# Patient Record
Sex: Female | Born: 1985 | Race: White | Hispanic: No | Smoking: Former smoker
Health system: Southern US, Community
[De-identification: ages and names within clinical notes are randomized; demographics above are authoritative.]

## PROBLEM LIST (undated history)

## (undated) DIAGNOSIS — N83209 Unspecified ovarian cyst, unspecified side: Secondary | ICD-10-CM

## (undated) DIAGNOSIS — N920 Excessive and frequent menstruation with regular cycle: Secondary | ICD-10-CM

## (undated) DIAGNOSIS — R51 Headache: Secondary | ICD-10-CM

## (undated) DIAGNOSIS — IMO0002 Reserved for concepts with insufficient information to code with codable children: Secondary | ICD-10-CM

## (undated) DIAGNOSIS — R519 Headache, unspecified: Secondary | ICD-10-CM

## (undated) DIAGNOSIS — N941 Unspecified dyspareunia: Secondary | ICD-10-CM

## (undated) DIAGNOSIS — J302 Other seasonal allergic rhinitis: Secondary | ICD-10-CM

## (undated) DIAGNOSIS — N921 Excessive and frequent menstruation with irregular cycle: Secondary | ICD-10-CM

## (undated) DIAGNOSIS — N730 Acute parametritis and pelvic cellulitis: Secondary | ICD-10-CM

## (undated) DIAGNOSIS — R638 Other symptoms and signs concerning food and fluid intake: Secondary | ICD-10-CM

## (undated) HISTORY — DX: Reserved for concepts with insufficient information to code with codable children: IMO0002

## (undated) HISTORY — DX: Excessive and frequent menstruation with irregular cycle: N92.1

## (undated) HISTORY — PX: OTHER SURGICAL HISTORY: SHX169

## (undated) HISTORY — DX: Unspecified ovarian cyst, unspecified side: N83.209

## (undated) HISTORY — PX: WISDOM TOOTH EXTRACTION: SHX21

## (undated) HISTORY — DX: Other seasonal allergic rhinitis: J30.2

## (undated) HISTORY — DX: Headache: R51

## (undated) HISTORY — DX: Acute parametritis and pelvic cellulitis: N73.0

## (undated) HISTORY — DX: Unspecified dyspareunia: N94.10

## (undated) HISTORY — DX: Excessive and frequent menstruation with regular cycle: N92.0

## (undated) HISTORY — DX: Headache, unspecified: R51.9

## (undated) HISTORY — DX: Other symptoms and signs concerning food and fluid intake: R63.8

## (undated) NOTE — *Deleted (*Deleted)
Preventive Care 21-39 Years Old, Female Preventive care refers to visits with your health care provider and lifestyle choices that can promote health and wellness. This includes:  A yearly physical exam. This may also be called an annual well check.  Regular dental visits and eye exams.  Immunizations.  Screening for certain conditions.  Healthy lifestyle choices, such as eating a healthy diet, getting regular exercise, not using drugs or products that contain nicotine and tobacco, and limiting alcohol use. What can I expect for my preventive care visit? Physical exam Your health care provider will check your:  Height and weight. This may be used to calculate body mass index (BMI), which tells if you are at a healthy weight.  Heart rate and blood pressure.  Skin for abnormal spots. Counseling Your health care provider may ask you questions about your:  Alcohol, tobacco, and drug use.  Emotional well-being.  Home and relationship well-being.  Sexual activity.  Eating habits.  Work and work environment.  Method of birth control.  Menstrual cycle.  Pregnancy history. What immunizations do I need?  Influenza (flu) vaccine  This is recommended every year. Tetanus, diphtheria, and pertussis (Tdap) vaccine  You may need a Td booster every 10 years. Varicella (chickenpox) vaccine  You may need this if you have not been vaccinated. Human papillomavirus (HPV) vaccine  If recommended by your health care provider, you may need three doses over 6 months. Measles, mumps, and rubella (MMR) vaccine  You may need at least one dose of MMR. You may also need a second dose. Meningococcal conjugate (MenACWY) vaccine  One dose is recommended if you are age 19-21 years and a first-year college student living in a residence hall, or if you have one of several medical conditions. You may also need additional booster doses. Pneumococcal conjugate (PCV13) vaccine  You may need  this if you have certain conditions and were not previously vaccinated. Pneumococcal polysaccharide (PPSV23) vaccine  You may need one or two doses if you smoke cigarettes or if you have certain conditions. Hepatitis A vaccine  You may need this if you have certain conditions or if you travel or work in places where you may be exposed to hepatitis A. Hepatitis B vaccine  You may need this if you have certain conditions or if you travel or work in places where you may be exposed to hepatitis B. Haemophilus influenzae type b (Hib) vaccine  You may need this if you have certain conditions. You may receive vaccines as individual doses or as more than one vaccine together in one shot (combination vaccines). Talk with your health care provider about the risks and benefits of combination vaccines. What tests do I need?  Blood tests  Lipid and cholesterol levels. These may be checked every 5 years starting at age 20.  Hepatitis C test.  Hepatitis B test. Screening  Diabetes screening. This is done by checking your blood sugar (glucose) after you have not eaten for a while (fasting).  Sexually transmitted disease (STD) testing.  BRCA-related cancer screening. This may be done if you have a family history of breast, ovarian, tubal, or peritoneal cancers.  Pelvic exam and Pap test. This may be done every 3 years starting at age 21. Starting at age 30, this may be done every 5 years if you have a Pap test in combination with an HPV test. Talk with your health care provider about your test results, treatment options, and if necessary, the need for more tests.   Follow these instructions at home: Eating and drinking   Eat a diet that includes fresh fruits and vegetables, whole grains, lean protein, and low-fat dairy.  Take vitamin and mineral supplements as recommended by your health care provider.  Do not drink alcohol if: ? Your health care provider tells you not to drink. ? You are  pregnant, may be pregnant, or are planning to become pregnant.  If you drink alcohol: ? Limit how much you have to 0-1 drink a day. ? Be aware of how much alcohol is in your drink. In the [place] one drink equals one 12 oz bottle of beer (355 mL), one 5 oz glass of wine (148 mL), or one 1 oz glass of hard liquor (44 mL). Lifestyle  Take daily care of your teeth and gums.  Stay active. Exercise for at least 30 minutes on 5 or more days each week.  Do not use any products that contain nicotine or tobacco, such as cigarettes, e-cigarettes, and chewing tobacco. If you need help quitting, ask your health care provider.  If you are sexually active, practice safe sex. Use a condom or other form of birth control (contraception) in order to prevent pregnancy and STIs (sexually transmitted infections). If you plan to become pregnant, see your health care provider for a preconception visit. What's next?  Visit your health care provider once a year for a well check visit.  Ask your health care provider how often you should have your eyes and teeth checked.  Stay up to date on all vaccines. This information is not intended to replace advice given to you by your health care provider. Make sure you discuss any questions you have with your health care provider. Document Revised: 10/20/2017 Document Reviewed: 10/20/2017 Elsevier Patient Education  2020 [employer] Breast Self-Awareness Breast self-awareness is knowing how your breasts look and feel. Doing breast self-awareness is important. It allows you to catch a breast problem early while it is still small and can be treated. All women should do breast self-awareness, including women who have had breast implants. Tell your doctor if you notice a change in your breasts. What you need:  A mirror.  A well-lit room. How to do a breast self-exam A breast self-exam is one way to learn what is normal for your breasts and to check for changes. To do a  breast self-exam: Look for changes  1. Take off all the clothes above your waist. 2. Stand in front of a mirror in a room with good lighting. 3. Put your hands on your hips. 4. Push your hands down. 5. Look at your breasts and nipples in the mirror to see if one breast or nipple looks different from the other. Check to see if: ? The shape of one breast is different. ? The size of one breast is different. ? There are wrinkles, dips, and bumps in one breast and not the other. 6. Look at each breast for changes in the skin, such as: ? Redness. ? Scaly areas. 7. Look for changes in your nipples, such as: ? Liquid around the nipples. ? Bleeding. ? Dimpling. ? Redness. ? A change in where the nipples are. Feel for changes  1. Lie on your back on the floor. 2. Feel each breast. To do this, follow these steps: ? Pick a breast to feel. ? Put the arm closest to that breast above your head. ? Use your other arm to feel the nipple area of your breast. Feel   the area with the pads of your three middle fingers by making small circles with your fingers. For the first circle, press lightly. For the second circle, press harder. For the third circle, press even harder. ? Keep making circles with your fingers at the different pressures as you move down your breast. Stop when you feel your ribs. ? Move your fingers a little toward the center of your body. ? Start making circles with your fingers again, this time going up until you reach your collarbone. ? Keep making up-and-down circles until you reach your armpit. Remember to keep using the three pressures. ? Feel the other breast in the same way. 3. Sit or stand in the tub or shower. 4. With soapy water on your skin, feel each breast the same way you did in step 2 when you were lying on the floor. Write down what you find Writing down what you find can help you remember what to tell your doctor. Write down:  What is normal for each breast.  Any  changes you find in each breast, including: ? The kind of changes you find. ? Whether you have pain. ? Size and location of any lumps.  When you last had your menstrual period. General tips  Check your breasts every month.  If you are breastfeeding, the best time to check your breasts is after you feed your baby or after you use a breast pump.  If you get menstrual periods, the best time to check your breasts is 5-7 days after your menstrual period is over.  With time, you will become comfortable with the self-exam, and you will begin to know if there are changes in your breasts. Contact a doctor if you:  See a change in the shape or size of your breasts or nipples.  See a change in the skin of your breast or nipples, such as red or scaly skin.  Have fluid coming from your nipples that is not normal.  Find a lump or thick area that was not there before.  Have pain in your breasts.  Have any concerns about your breast health. Summary  Breast self-awareness includes looking for changes in your breasts, as well as feeling for changes within your breasts.  Breast self-awareness should be done in front of a mirror in a well-lit room.  You should check your breasts every month. If you get menstrual periods, the best time to check your breasts is 5-7 days after your menstrual period is over.  Let your doctor know of any changes you see in your breasts, including changes in size, changes on the skin, pain or tenderness, or fluid from your nipples that is not normal. This information is not intended to replace advice given to you by your health care provider. Make sure you discuss any questions you have with your health care provider. Document Revised: 09/27/2017 Document Reviewed: 09/27/2017 Elsevier Patient Education  2020 Elsevier Inc.  

---

## 2013-10-07 ENCOUNTER — Emergency Department: Payer: Self-pay | Admitting: Internal Medicine

## 2013-10-07 LAB — CBC WITH DIFFERENTIAL/PLATELET
Basophil #: 0.1 10*3/uL (ref 0.0–0.1)
Basophil %: 0.7 %
EOS ABS: 0.2 10*3/uL (ref 0.0–0.7)
EOS PCT: 1.9 %
HCT: 39.2 % (ref 35.0–47.0)
HGB: 13.7 g/dL (ref 12.0–16.0)
Lymphocyte #: 3.2 10*3/uL (ref 1.0–3.6)
Lymphocyte %: 35.1 %
MCH: 32.3 pg (ref 26.0–34.0)
MCHC: 34.9 g/dL (ref 32.0–36.0)
MCV: 93 fL (ref 80–100)
Monocyte #: 0.7 x10 3/mm (ref 0.2–0.9)
Monocyte %: 7.3 %
Neutrophil #: 5 10*3/uL (ref 1.4–6.5)
Neutrophil %: 55 %
Platelet: 244 10*3/uL (ref 150–440)
RBC: 4.23 10*6/uL (ref 3.80–5.20)
RDW: 13.1 % (ref 11.5–14.5)
WBC: 9 10*3/uL (ref 3.6–11.0)

## 2013-10-07 LAB — COMPREHENSIVE METABOLIC PANEL
ALK PHOS: 46 U/L
ANION GAP: 5 — AB (ref 7–16)
AST: 16 U/L (ref 15–37)
Albumin: 4.1 g/dL (ref 3.4–5.0)
BUN: 16 mg/dL (ref 7–18)
Bilirubin,Total: 0.7 mg/dL (ref 0.2–1.0)
CALCIUM: 8.7 mg/dL (ref 8.5–10.1)
Chloride: 107 mmol/L (ref 98–107)
Co2: 25 mmol/L (ref 21–32)
Creatinine: 0.77 mg/dL (ref 0.60–1.30)
EGFR (African American): >60
EGFR (Non-African Amer.): >60
Glucose: 98 mg/dL (ref 65–99)
OSMOLALITY: 275 (ref 275–301)
POTASSIUM: 3.7 mmol/L (ref 3.5–5.1)
SGPT (ALT): 27 U/L
SODIUM: 137 mmol/L (ref 136–145)
Total Protein: 7.5 g/dL (ref 6.4–8.2)

## 2013-10-07 LAB — URINALYSIS, COMPLETE
Bacteria: NONE SEEN
Bilirubin,UR: NEGATIVE
Glucose,UR: NEGATIVE mg/dL (ref 0–75)
KETONE: NEGATIVE
Leukocyte Esterase: NEGATIVE
Nitrite: NEGATIVE
PH: 5 (ref 4.5–8.0)
Protein: NEGATIVE
Specific Gravity: 1.025 (ref 1.003–1.030)
Squamous Epithelial: 1

## 2013-10-07 LAB — GC/CHLAMYDIA PROBE AMP

## 2013-10-07 LAB — LIPASE, BLOOD: Lipase: 145 U/L (ref 73–393)

## 2013-10-07 LAB — WET PREP, GENITAL

## 2014-04-04 LAB — HM PAP SMEAR: HM Pap smear: NEGATIVE

## 2015-01-11 ENCOUNTER — Other Ambulatory Visit: Payer: Self-pay | Admitting: Obstetrics and Gynecology

## 2015-04-02 ENCOUNTER — Encounter: Payer: Self-pay | Admitting: Obstetrics and Gynecology

## 2016-04-22 IMAGING — US US PELV - US TRANSVAGINAL
1 series · 13 of 25 positions shown · non-contrast
Comparison: None

CLINICAL DATA: Pelvic pain.

EXAM:
TRANSABDOMINAL AND TRANSVAGINAL ULTRASOUND OF PELVIS
TECHNIQUE: Both transabdominal and transvaginal ultrasound examinations of the
pelvis were performed. Transabdominal technique was performed for
global imaging of the pelvis including uterus, ovaries, adnexal
regions, and pelvic cul-de-sac. It was necessary to proceed with
endovaginal exam following the transabdominal exam to visualize the
endometrium and right ovary to better advantage.

[Series 1: us pelv - us transvaginal · 0.24mm/px · 13 of 77 slices shown]
[im 1/77]
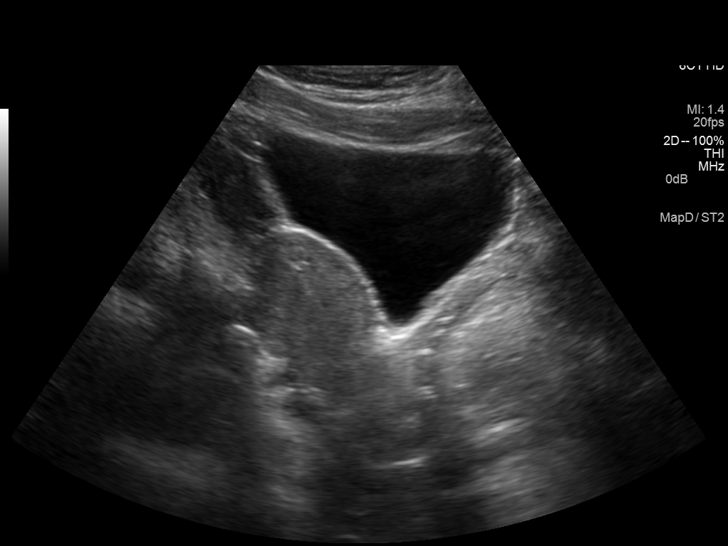
[im 7/77]
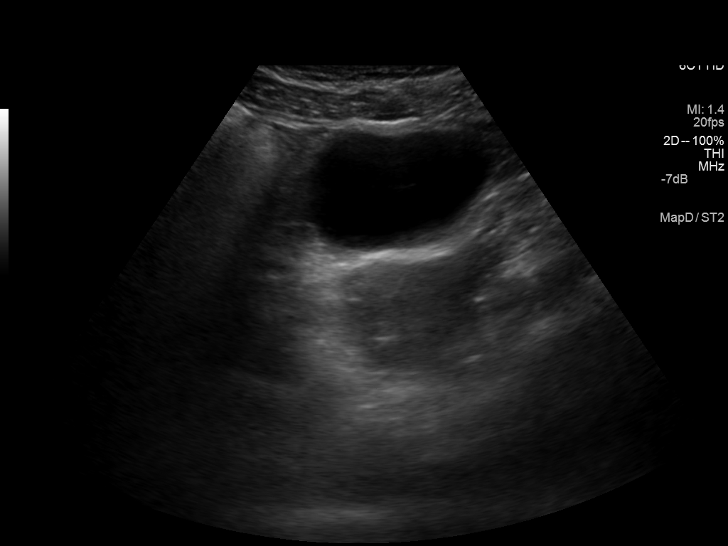
[im 13/77]
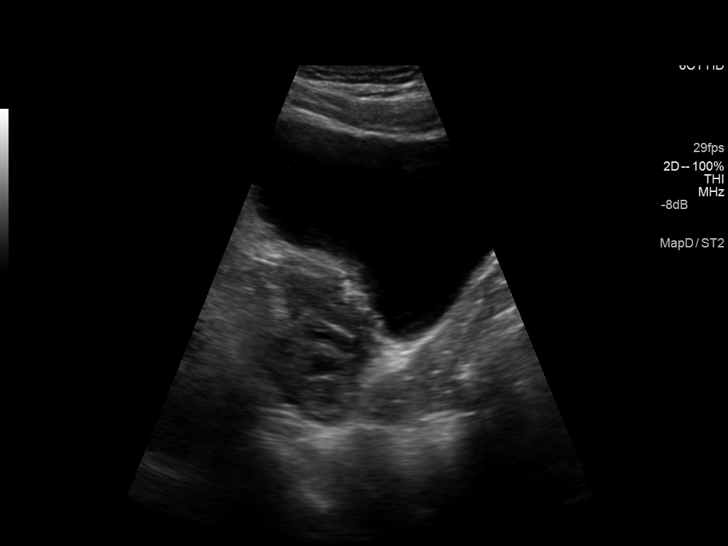
[im 20/77]
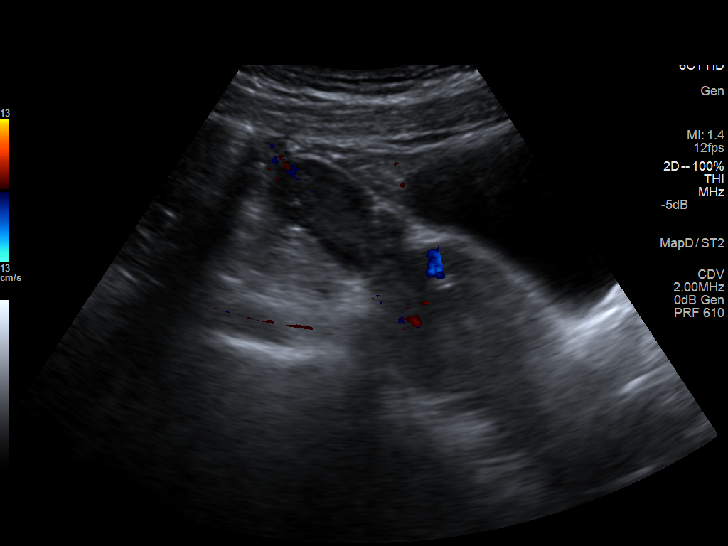
[im 26/77]
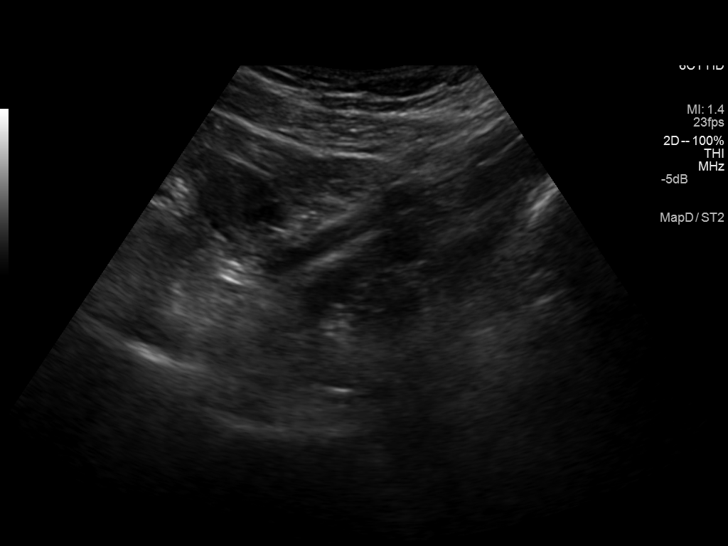
[im 32/77]
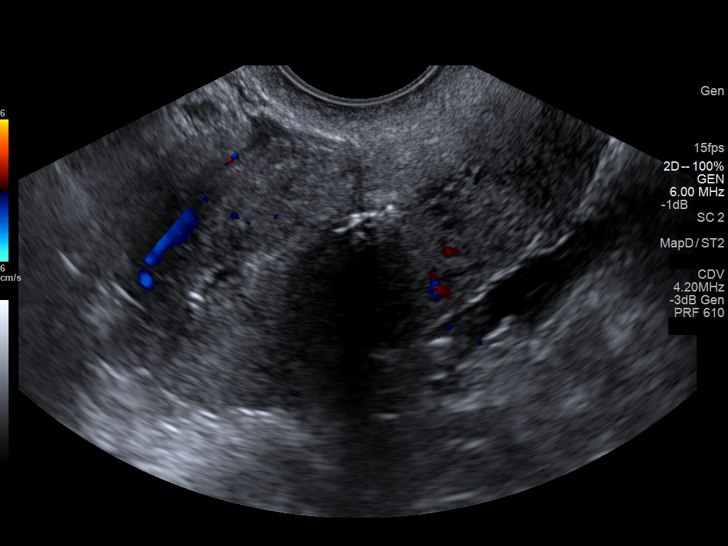
[im 39/77]
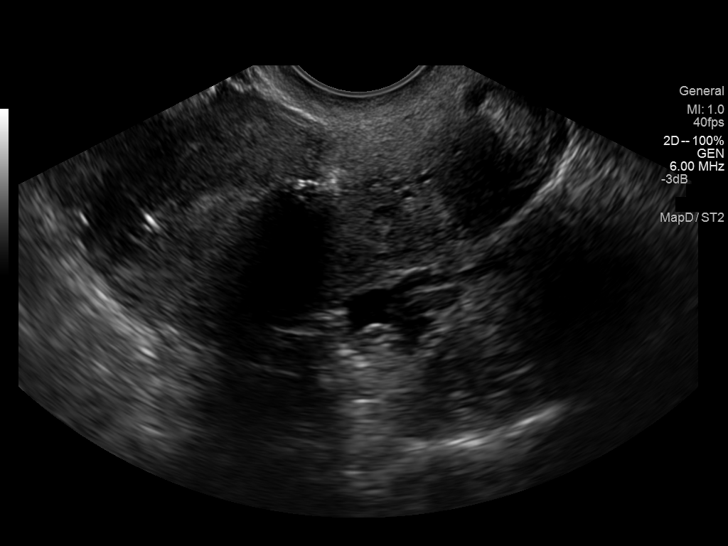
[im 45/77]
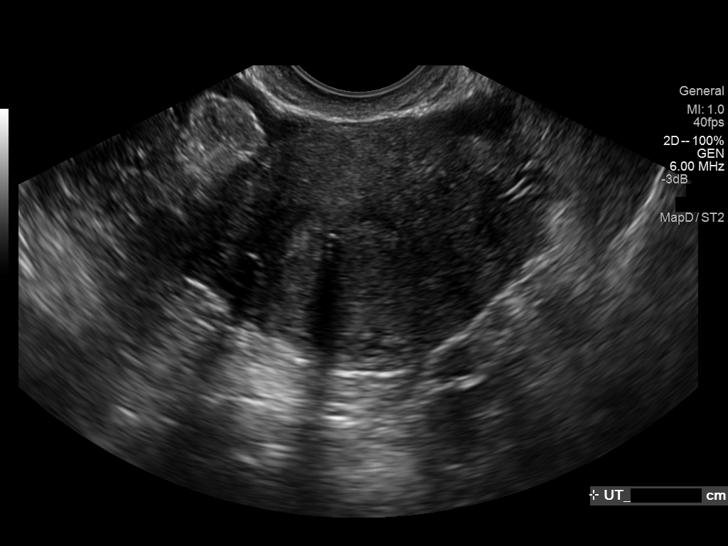
[im 51/77]
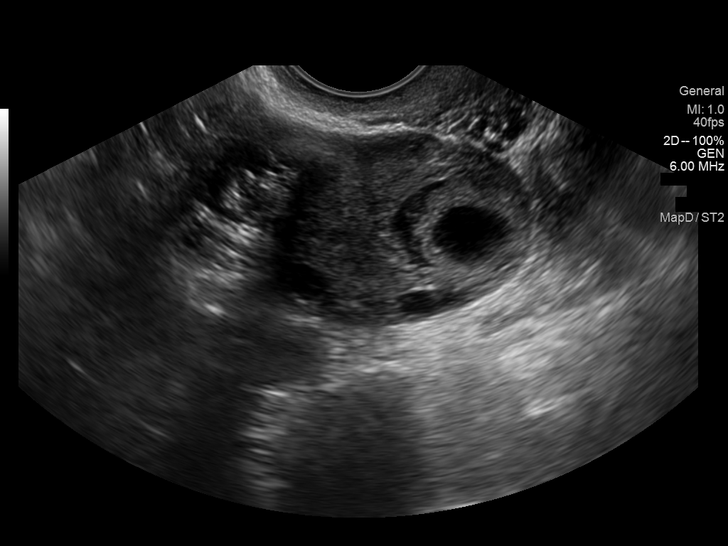
[im 58/77]
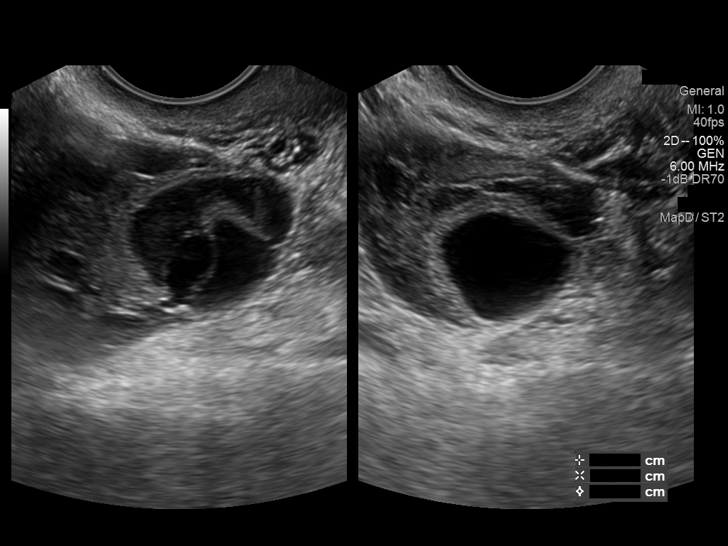
[im 64/77]
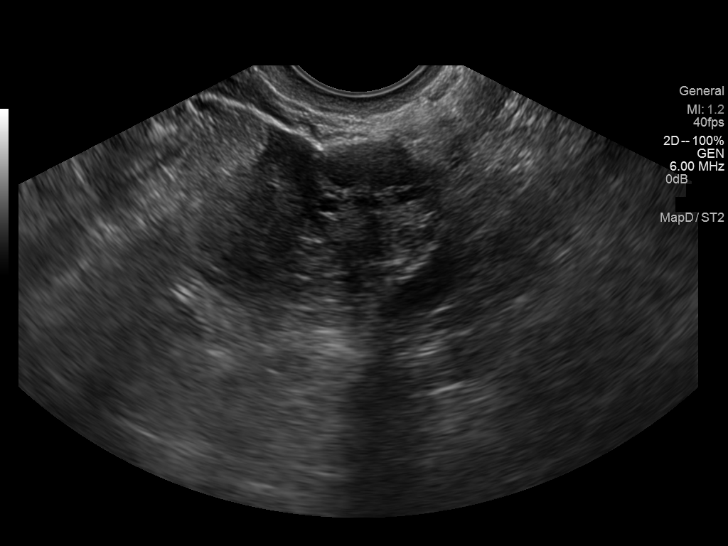
[im 70/77]
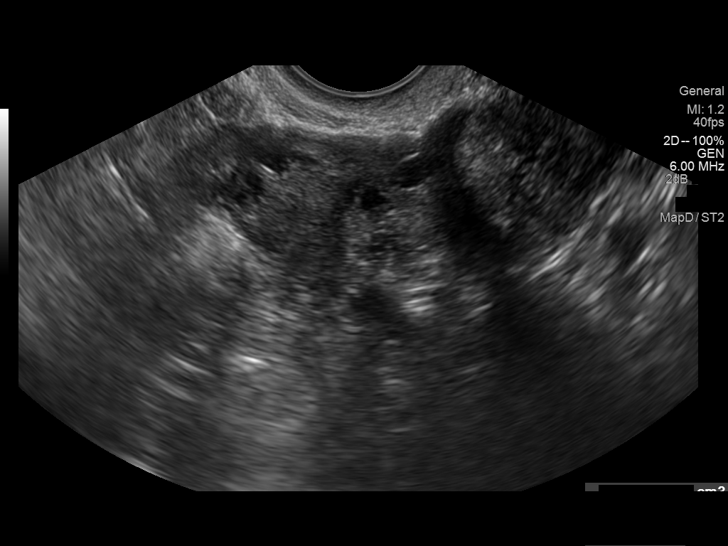
[im 77/77]
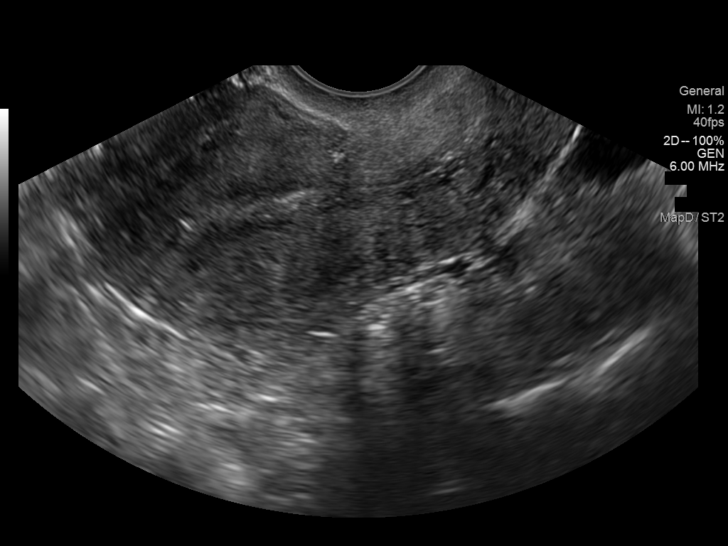

[13 of 25 positions shown; findings below may reference images not displayed]

FINDINGS: Uterus

Measurements: 8.1 cm x 4.0 cm x 5.4 cm. No fibroids or other mass
visualized.

Endometrium

Thickness: 5.5 mm. IUD is well positioned in the uterus. There is a
trace amount of endometrial canal fluid. No evidence of an
endometrial mass.

Right ovary

Measurements: 2.7 cm x 4.5 cm x 4.9 cm. Complex partly septated cyst
measuring 2.7 cm. No other ovarian abnormality. No adnexal mass.

Left ovary

Measurements: 2.1 cm x 2.7 cm x 4.1 cm. Normal appearance/no adnexal
mass.

Other findings

Trace amount pelvic free fluid.
IMPRESSION: 1. No acute findings.
2. Well-positioned IUD. No endometrial thickening. Trace amount of
endometrial fluid is nonspecific. No endometrial mass.
3. Complex right ovarian cyst likely a hemorrhagic cyst. Ovaries
otherwise unremarkable.
4. No adnexal masses.  Physiologic trace free fluid.

## 2016-06-04 ENCOUNTER — Encounter: Payer: Self-pay | Admitting: Emergency Medicine

## 2016-06-04 ENCOUNTER — Emergency Department: Payer: Self-pay

## 2016-06-04 ENCOUNTER — Emergency Department
Admission: EM | Admit: 2016-06-04 | Discharge: 2016-06-04 | Disposition: A | Discharge status: Home / Self Care | Payer: Self-pay | Attending: Emergency Medicine | Admitting: Emergency Medicine

## 2016-06-04 DIAGNOSIS — Z87891 Personal history of nicotine dependence: Secondary | ICD-10-CM | POA: Insufficient documentation

## 2016-06-04 DIAGNOSIS — F1290 Cannabis use, unspecified, uncomplicated: Secondary | ICD-10-CM | POA: Insufficient documentation

## 2016-06-04 DIAGNOSIS — N730 Acute parametritis and pelvic cellulitis: Secondary | ICD-10-CM | POA: Insufficient documentation

## 2016-06-04 DIAGNOSIS — R102 Pelvic and perineal pain: Secondary | ICD-10-CM

## 2016-06-04 LAB — WET PREP, GENITAL
Sperm: NONE SEEN
TRICH WET PREP: NONE SEEN
Yeast Wet Prep HPF POC: NONE SEEN

## 2016-06-04 LAB — URINALYSIS, COMPLETE (UACMP) WITH MICROSCOPIC
Bilirubin Urine: NEGATIVE
GLUCOSE, UA: NEGATIVE mg/dL
Hgb urine dipstick: NEGATIVE
Ketones, ur: NEGATIVE mg/dL
Leukocytes, UA: NEGATIVE
Nitrite: NEGATIVE
PH: 6 (ref 5.0–8.0)
Protein, ur: NEGATIVE mg/dL
Specific Gravity, Urine: 1.01 (ref 1.005–1.030)

## 2016-06-04 LAB — CHLAMYDIA/NGC RT PCR (ARMC ONLY)
CHLAMYDIA TR: NOT DETECTED
N GONORRHOEAE: NOT DETECTED

## 2016-06-04 LAB — POCT PREGNANCY, URINE: Preg Test, Ur: NEGATIVE

## 2016-06-04 MED ORDER — CEFTRIAXONE SODIUM 250 MG IJ SOLR
250.0000 mg | Freq: Once | INTRAMUSCULAR | Status: AC
  Administered 2016-06-04: 250 mg via INTRAMUSCULAR
  Filled 2016-06-04: qty 250

## 2016-06-04 MED ORDER — MORPHINE SULFATE (PF) 2 MG/ML IV SOLN
2.0000 mg | Freq: Once | INTRAVENOUS | Status: AC
  Administered 2016-06-04: 2 mg via INTRAVENOUS
  Filled 2016-06-04: qty 1

## 2016-06-04 MED ORDER — MORPHINE SULFATE (PF) 4 MG/ML IV SOLN
4.0000 mg | Freq: Once | INTRAVENOUS | Status: AC
  Administered 2016-06-04: 4 mg via INTRAVENOUS

## 2016-06-04 MED ORDER — DOXYCYCLINE HYCLATE 50 MG PO CAPS: 100.0000 mg | ORAL_CAPSULE | Freq: Two times a day (BID) | ORAL | 0 refills | Status: AC

## 2016-06-04 MED ORDER — MORPHINE SULFATE (PF) 4 MG/ML IV SOLN
INTRAVENOUS | Status: AC
  Administered 2016-06-04: 4 mg via INTRAVENOUS
  Filled 2016-06-04: qty 1

## 2016-06-04 MED ORDER — AZITHROMYCIN 1 G PO PACK
1.0000 g | PACK | Freq: Once | ORAL | Status: AC
  Administered 2016-06-04: 1 g via ORAL
  Filled 2016-06-04: qty 1

## 2016-06-04 MED ORDER — ONDANSETRON HCL 4 MG/2ML IJ SOLN
4.0000 mg | Freq: Once | INTRAMUSCULAR | Status: AC
  Administered 2016-06-04: 4 mg via INTRAVENOUS
  Filled 2016-06-04: qty 2

## 2016-06-04 NOTE — ED Notes (Signed)
Pt is getting a pelvic exam; MD at bedside with a female ED tech Misty Stanley).

## 2016-06-04 NOTE — ED Notes (Signed)
Pt returned from Ultrasound.

## 2016-06-04 NOTE — ED Triage Notes (Signed)
Pt to tx room via [facility], report lower abd pain starting 1 hour ago, reports pain started while having intercourse, pt describes pain as "grabbing/twisting" pain, reports hx ovarian cyst and states similar.

## 2016-06-04 NOTE — ED Notes (Signed)

## 2016-06-04 NOTE — ED Provider Notes (Signed)
[facility] Emergency Department Provider Note   First MD Initiated Contact with Patient 06/04/16 [phone]     (approximate)  I have reviewed the triage vital signs and the nursing notes.   HISTORY  Chief Complaint Pelvic Pain   HPI Leslie Walters is a 31 y.o. female presents with dyspareunia. Patient states during sexual intercourse tonight severe 10 out of 10 pelvic discomfort approximately one hour ago and it has persisted since then. Patient admits to previous episodes of the same. Stating that this has occurred at least 5 times her lifetime. Patient states the last episode she was seen at [facility] emergency department and diagnosed with a ruptured ovarian cyst. Patient denies any dysuria no vaginal discharge no fever. Patient denies any vomiting or diarrhea   Past Medical History:  Diagnosis Date  . Dyspareunia in female   . Headache   . Heavy periods   . Increased BMI   . Menometrorrhagia   . Ovarian cyst   . 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 Seasonal allergies     There are no active problems to display for this patient.   Past Surgical History:  Procedure Laterality Date  . CESAREAN SECTION    . colposcopy    . WISDOM TOOTH EXTRACTION      Prior to Admission medications   Medication Sig Start Date End Date Taking? Authorizing Provider  doxycycline (VIBRAMYCIN) 50 MG capsule Take 2 capsules (100 mg total) by mouth 2 (two) times daily. 06/04/16 06/18/16  Darci Current, MD  MICROGESTIN 1.5-30 MG-MCG tablet TAKE ONE TABLET BY MOUTH ONE TIME DAILY 01/13/15   Herold Harms, MD    Allergies No known drug allergies  Family History  Problem Relation Age of Onset  . Heart disease Mother   . Ovarian cancer Maternal Aunt   . Breast cancer Maternal Aunt   . Diabetes Maternal Grandfather   . Colon cancer Neg Hx     Social History Social History  Substance Use Topics  . Smoking status: Former Smoker    Types:  Cigarettes  . Smokeless tobacco: Never Used  . Alcohol use Yes     Comment: occas    Review of Systems Constitutional: No fever/chills Eyes: No visual changes. ENT: No sore throat. Cardiovascular: Denies chest pain. Respiratory: Denies shortness of breath. Gastrointestinal: No abdominal pain.  No nausea, no vomiting.  No diarrhea.  No constipation. Genitourinary: Negative for dysuria. Musculoskeletal: Negative for back pain. Skin: Negative for rash. Neurological: Negative for headaches, focal weakness or numbness.  10-point ROS otherwise negative.  ____________________________________________   PHYSICAL EXAM:  VITAL SIGNS: ED Triage Vitals  Enc Vitals Group     BP 06/04/16 0343 136/76     Pulse Rate 06/04/16 0343 99     Resp 06/04/16 0343 16     Temp 06/04/16 0343 98.7 F (37.1 C)     Temp Source 06/04/16 0343 Oral     SpO2 06/04/16 0343 100 %     Weight 06/04/16 0344 180 lb (81.6 kg)     Height 06/04/16 0344  (1.6 m)     Head Circumference --      Peak Flow --      Pain Score 06/04/16 0343 5     Pain Loc --      Pain Edu? --      Excl. in GC? --     Constitutional: Alert and oriented. Well appearing and in no acute  distress. Eyes: Conjunctivae are normal. PERRL. EOMI. Head: Atraumatic. Mouth/Throat: Mucous membranes are moist Neck: No stridor.   Cardiovascular: Normal rate, regular rhythm. Good peripheral circulation. Grossly normal heart sounds. Respiratory: Normal respiratory effort.  No retractions. Lungs CTAB. Gastrointestinal: Soft and nontender. No distention.  Genitourinary: No vaginal discharge noted marked cervical motion tenderness Musculoskeletal: No lower extremity tenderness nor edema. No gross deformities of extremities. Neurologic:  Normal speech and language. No gross focal neurologic deficits are appreciated.  Skin:  Skin is warm, dry and intact. No rash noted. Psychiatric: Mood and affect are normal. Speech and behavior are  normal.  ____________________________________________   LABS (all labs ordered are listed, but only abnormal results are displayed)  Labs Reviewed  WET PREP, GENITAL - Abnormal; Notable for the following:       Result Value   Clue Cells Wet Prep HPF POC PRESENT (*)    WBC, Wet Prep HPF POC FEW (*)    All other components within normal limits  URINALYSIS, COMPLETE (UACMP) WITH MICROSCOPIC - Abnormal; Notable for the following:    Color, Urine YELLOW (*)    APPearance CLEAR (*)    Bacteria, UA RARE (*)    Squamous Epithelial / LPF 0-5 (*)    All other components within normal limits  CHLAMYDIA/NGC RT PCR ([facility] ONLY)  POCT PREGNANCY, URINE    RADIOLOGY I, Graton N Vic Esco, personally viewed and evaluated these images (plain radiographs) as part of my medical decision making, as well as reviewing the written report by the radiologist.  US Transvaginal Non-ob  Result Date: 06/04/2016 CLINICAL DATA:  Acute onset of left pelvic pain.  Initial encounter. EXAM: TRANSABDOMINAL AND TRANSVAGINAL ULTRASOUND OF PELVIS TECHNIQUE: Both transabdominal and transvaginal ultrasound examinations of the pelvis were performed. Transabdominal technique was performed for global imaging of the pelvis including uterus, ovaries, adnexal regions, and pelvic cul-de-sac. It was necessary to proceed with endovaginal exam following the transabdominal exam to visualize the uterus and ovaries in greater detail. COMPARISON:  Pelvic ultrasound performed 09/27/2013 FINDINGS: Uterus Measurements: 7.9 x 4.2 x 5.1 cm. No fibroids or other mass visualized. Endometrium Thickness: 0.7 cm.  No focal abnormality visualized. Right ovary Measurements: 4.1 x 2.8 x 3.3 cm. Normal appearance/no adnexal mass. Left ovary Measurements: 2.8 x 2.0 x 2.5 cm. Normal appearance/no adnexal mass. Other findings Trace free fluid is noted within the pelvic cul-de-sac. IMPRESSION: Unremarkable pelvic ultrasound.  No evidence for ovarian  torsion. Electronically Signed   By: Roanna Raider M.D.   On: 06/04/2016 05:13   US Pelvis Complete  Result Date: 06/04/2016 CLINICAL DATA:  Acute onset of left pelvic pain.  Initial encounter. EXAM: TRANSABDOMINAL AND TRANSVAGINAL ULTRASOUND OF PELVIS TECHNIQUE: Both transabdominal and transvaginal ultrasound examinations of the pelvis were performed. Transabdominal technique was performed for global imaging of the pelvis including uterus, ovaries, adnexal regions, and pelvic cul-de-sac. It was necessary to proceed with endovaginal exam following the transabdominal exam to visualize the uterus and ovaries in greater detail. COMPARISON:  Pelvic ultrasound performed 09/27/2013 FINDINGS: Uterus Measurements: 7.9 x 4.2 x 5.1 cm. No fibroids or other mass visualized. Endometrium Thickness: 0.7 cm.  No focal abnormality visualized. Right ovary Measurements: 4.1 x 2.8 x 3.3 cm. Normal appearance/no adnexal mass. Left ovary Measurements: 2.8 x 2.0 x 2.5 cm. Normal appearance/no adnexal mass. Other findings Trace free fluid is noted within the pelvic cul-de-sac. IMPRESSION: Unremarkable pelvic ultrasound.  No evidence for ovarian torsion. Electronically Signed   By: Beryle Beams.D.  On: 06/04/2016 05:13     Procedures   ____________________________________________   INITIAL IMPRESSION / ASSESSMENT AND PLAN / ED COURSE  Pertinent labs & imaging results that were available during my care of the patient were reviewed by me and considered in my medical decision making (see chart for details).  Given history physical exam with market cervical motion tenderness concern for possible PID. Ultrasound revealed trace pelvic fluid no ovarian cysts identified per radiologist. As such plan to treat the patient for PID and referred to OB/GYN.      ____________________________________________  FINAL CLINICAL IMPRESSION(S) / ED DIAGNOSES  Final diagnoses:  PID (acute pelvic inflammatory disease)      MEDICATIONS GIVEN DURING THIS VISIT:  Medications  morphine 2 MG/ML injection 2 mg (2 mg Intravenous Given 06/04/16 0427)  ondansetron (ZOFRAN) injection 4 mg (4 mg Intravenous Given 06/04/16 0427)  morphine 4 MG/ML injection 4 mg (4 mg Intravenous Given 06/04/16 0450)  cefTRIAXone (ROCEPHIN) injection 250 mg (250 mg Intramuscular Given 06/04/16 0608)  azithromycin (ZITHROMAX) powder 1 g (1 g Oral Given 06/04/16 0609)     NEW OUTPATIENT MEDICATIONS STARTED DURING THIS VISIT:  New Prescriptions   DOXYCYCLINE (VIBRAMYCIN) 50 MG CAPSULE    Take 2 capsules (100 mg total) by mouth 2 (two) times daily.    Modified Medications   No medications on file    Discontinued Medications   No medications on file     Note:  This document was prepared using Dragon voice recognition software and may include unintentional dictation errors.    Darci Current, MD 06/04/16 [phone]

## 2017-07-29 ENCOUNTER — Ambulatory Visit (INDEPENDENT_AMBULATORY_CARE_PROVIDER_SITE_OTHER): Payer: 59 | Admitting: Obstetrics and Gynecology

## 2017-07-29 ENCOUNTER — Ambulatory Visit (INDEPENDENT_AMBULATORY_CARE_PROVIDER_SITE_OTHER): Payer: 59

## 2017-07-29 VITALS — BP 112/69 | HR 85 | Ht 63.0 in | Wt 171.7 lb

## 2017-07-29 DIAGNOSIS — Z202 Contact with and (suspected) exposure to infections with a predominantly sexual mode of transmission: Secondary | ICD-10-CM

## 2017-07-29 DIAGNOSIS — Z3402 Encounter for supervision of normal first pregnancy, second trimester: Secondary | ICD-10-CM

## 2017-07-29 DIAGNOSIS — Z3687 Encounter for antenatal screening for uncertain dates: Secondary | ICD-10-CM | POA: Diagnosis not present

## 2017-07-29 DIAGNOSIS — N939 Abnormal uterine and vaginal bleeding, unspecified: Secondary | ICD-10-CM | POA: Diagnosis not present

## 2017-07-29 DIAGNOSIS — T7589XA Other specified effects of external causes, initial encounter: Secondary | ICD-10-CM

## 2017-07-29 LAB — POCT URINE PREGNANCY: PREG TEST UR: POSITIVE — AB

## 2017-07-29 NOTE — Patient Instructions (Addendum)
WHAT OB PATIENTS CAN EXPECT   Confirmation of pregnancy and ultrasound ordered if medically indicated-[redacted] weeks gestation  New OB (NOB) intake with nurse and New OB (NOB) labs- [redacted] weeks gestation  New OB (NOB) physical examination with provider- 11/[redacted] weeks gestation  Flu vaccine-[redacted] weeks gestation  Anatomy scan-[redacted] weeks gestation  Glucose tolerance test, blood work to test for anemia, T-dap vaccine-[redacted] weeks gestation  Vaginal swabs/cultures-STD/Group B strep-[redacted] weeks gestation  Appointments every 4 weeks until 28 weeks  Every 2 weeks from 28 weeks until 36 weeks  Weekly visits from 36 weeks until delivery  How a Baby Grows During Pregnancy Pregnancy begins when a female's sperm enters a female's egg (fertilization). This happens in one of the tubes (fallopian tubes) that connect the ovaries to the womb (uterus). The fertilized egg is called an embryo until it reaches 10 weeks. From 10 weeks until birth, it is called a fetus. The fertilized egg moves down the fallopian tube to the uterus. Then it implants into the lining of the uterus and begins to grow. The developing fetus receives oxygen and nutrients through the pregnant woman's bloodstream and the tissues that grow (placenta) to support the fetus. The placenta is the life support system for the fetus. It provides nutrition and removes waste. Learning as much as you can about your pregnancy and how your baby is developing can help you enjoy the experience. It can also make you aware of when there might be a problem and when to ask questions. How long does a typical pregnancy last? A pregnancy usually lasts 280 days, or about 40 weeks. Pregnancy is divided into three trimesters:  First trimester: 0-13 weeks.  Second trimester: 14-27 weeks.  Third trimester: 28-40 weeks.  The day when your baby is considered ready to be born (full term) is your estimated date of delivery. How does my baby develop month by month? First month  The  fertilized egg attaches to the inside of the uterus.  Some cells will form the placenta. Others will form the fetus.  The arms, legs, brain, spinal cord, lungs, and heart begin to develop.  At the end of the first month, the heart begins to beat.  Second month  The bones, inner ear, eyelids, hands, and feet form.  The genitals develop.  By the end of 8 weeks, all major organs are developing.  Third month  All of the internal organs are forming.  Teeth develop below the gums.  Bones and muscles begin to grow. The spine can flex.  The skin is transparent.  Fingernails and toenails begin to form.  Arms and legs continue to grow longer, and hands and feet develop.  The fetus is about 3 in (7.6 cm) long.  Fourth month  The placenta is completely formed.  The external sex organs, neck, outer ear, eyebrows, eyelids, and fingernails are formed.  The fetus can hear, swallow, and move its arms and legs.  The kidneys begin to produce urine.  The skin is covered with a white waxy coating (vernix) and very fine hair (lanugo).  Fifth month  The fetus moves around more and can be felt for the first time (quickening).  The fetus starts to sleep and wake up and may begin to suck its finger.  The nails grow to the end of the fingers.  The organ in the digestive system that makes bile (gallbladder) functions and helps to digest the nutrients.  If your baby is a girl, eggs are present in  her ovaries. If your baby is a boy, testicles start to move down into his scrotum.  Sixth month  The lungs are formed, but the fetus is not yet able to breathe.  The eyes open. The brain continues to develop.  Your baby has fingerprints and toe prints. Your baby's hair grows thicker.  At the end of the second trimester, the fetus is about 9 in (22.9 cm) long.  Seventh month  The fetus kicks and stretches.  The eyes are developed enough to sense changes in light.  The hands can make  a grasping motion.  The fetus responds to sound.  Eighth month  All organs and body systems are fully developed and functioning.  Bones harden and taste buds develop. The fetus may hiccup.  Certain areas of the brain are still developing. The skull remains soft.  Ninth month  The fetus gains about  lb (0.23 kg) each week.  The lungs are fully developed.  Patterns of sleep develop.  The fetus's head typically moves into a head-down position (vertex) in the uterus to prepare for birth. If the buttocks move into a vertex position instead, the baby is breech.  The fetus weighs 6-9 lbs (2.72-4.08 kg) and is 19-20 in (48.26-50.8 cm) long.  What can I do to have a healthy pregnancy and help my baby develop? Eating and Drinking  Eat a healthy diet. ? Talk with your health care provider to make sure that you are getting the nutrients that you and your baby need. ? Visit www.BuildDNA.es to learn about creating a healthy diet.  Gain a healthy amount of weight during pregnancy as advised by your health care provider. This is usually 25-35 pounds. You may need to: ? Gain more if you were underweight before getting pregnant or if you are pregnant with more than one baby. ? Gain less if you were overweight or obese when you got pregnant.  Medicines and Vitamins  Take prenatal vitamins as directed by your health care provider. These include vitamins such as folic acid, iron, calcium, and vitamin D. They are important for healthy development.  Take medicines only as directed by your health care provider. Read labels and ask a pharmacist or your health care provider whether over-the-counter medicines, supplements, and prescription drugs are safe to take during pregnancy.  Activities  Be physically active as advised by your health care provider. Ask your health care provider to recommend activities that are safe for you to do, such as walking or swimming.  Do not participate in  strenuous or extreme sports.  Lifestyle  Do not drink alcohol.  Do not use any tobacco products, including cigarettes, chewing tobacco, or electronic cigarettes. If you need help quitting, ask your health care provider.  Do not use illegal drugs.  Safety  Avoid exposure to mercury, lead, or other heavy metals. Ask your health care provider about common sources of these heavy metals.  Avoid listeria infection during pregnancy. Follow these precautions: ? Do not eat soft cheeses or deli meats. ? Do not eat hot dogs unless they have been warmed up to the point of steaming, such as in the microwave oven. ? Do not drink unpasteurized milk.  Avoid toxoplasmosis infection during pregnancy. Follow these precautions: ? Do not change your cat's litter box, if you have a cat. Ask someone else to do this for you. ? Wear gardening gloves while working in the yard.  General Instructions  Keep all follow-up visits as directed by your health  care provider. This is important. This includes prenatal care and screening tests.  Manage any chronic health conditions. Work closely with your health care provider to keep conditions, such as diabetes, under control.  How do I know if my baby is developing well? At each prenatal visit, your health care provider will do several different tests to check on your health and keep track of your baby's development. These include:  Fundal height. ? Your health care provider will measure your growing belly from top to bottom using a tape measure. ? Your health care provider will also feel your belly to determine your baby's position.  Heartbeat. ? An ultrasound in the first trimester can confirm pregnancy and show a heartbeat, depending on how far along you are. ? Your health care provider will check your baby's heart rate at every prenatal visit. ? As you get closer to your delivery date, you may have regular fetal heart rate monitoring to make sure that your  baby is not in distress.  Second trimester ultrasound. ? This ultrasound checks your baby's development. It also indicates your baby's gender.  What should I do if I have concerns about my baby's development? Always talk with your health care provider about any concerns that you may have. This information is not intended to replace advice given to you by your health care provider. Make sure you discuss any questions you have with your health care provider. Document Released: 07/28/2007 Document Revised: 07/17/2015 Document Reviewed: 07/18/2013 Elsevier Interactive Patient Education  2018 [place] of Pregnancy The first trimester of pregnancy is from week 1 until the end of week 13 (months 1 through 3). A week after a sperm fertilizes an egg, the egg will implant on the wall of the uterus. This embryo will begin to develop into a baby. Genes from you and your partner will form the baby. The female genes will determine whether the baby will be a boy or a girl. At 6-8 weeks, the eyes and face will be formed, and the heartbeat can be seen on ultrasound. At the end of 12 weeks, all the baby's organs will be formed. Now that you are pregnant, you will want to do everything you can to have a healthy baby. Two of the most important things are to get good prenatal care and to follow your health care provider's instructions. Prenatal care is all the medical care you receive before the baby's birth. This care will help prevent, find, and treat any problems during the pregnancy and childbirth. Body changes during your first trimester Your body goes through many changes during pregnancy. The changes vary from woman to woman.  You may gain or lose a couple of pounds at first.  You may feel sick to your stomach (nauseous) and you may throw up (vomit). If the vomiting is uncontrollable, call your health care provider.  You may tire easily.  You may develop headaches that can be relieved by  medicines. All medicines should be approved by your health care provider.  You may urinate more often. Painful urination may mean you have a bladder infection.  You may develop heartburn as a result of your pregnancy.  You may develop constipation because certain hormones are causing the muscles that push stool through your intestines to slow down.  You may develop hemorrhoids or swollen veins (varicose veins).  Your breasts may begin to grow larger and become tender. Your nipples may stick out more, and the tissue that surrounds them (  areola) may become darker.  Your gums may bleed and may be sensitive to brushing and flossing.  Dark spots or blotches (chloasma, mask of pregnancy) may develop on your face. This will likely fade after the baby is born.  Your menstrual periods will stop.  You may have a loss of appetite.  You may develop cravings for certain kinds of food.  You may have changes in your emotions from day to day, such as being excited to be pregnant or being concerned that something may go wrong with the pregnancy and baby.  You may have more vivid and strange dreams.  You may have changes in your hair. These can include thickening of your hair, rapid growth, and changes in texture. Some women also have hair loss during or after pregnancy, or hair that feels dry or thin. Your hair will most likely return to normal after your baby is born.  What to expect at prenatal visits During a routine prenatal visit:  You will be weighed to make sure you and the baby are growing normally.  Your blood pressure will be taken.  Your abdomen will be measured to track your baby's growth.  The fetal heartbeat will be listened to between weeks 10 and 14 of your pregnancy.  Test results from any previous visits will be discussed.  Your health care provider may ask you:  How you are feeling.  If you are feeling the baby move.  If you have had any abnormal symptoms, such as  leaking fluid, bleeding, severe headaches, or abdominal cramping.  If you are using any tobacco products, including cigarettes, chewing tobacco, and electronic cigarettes.  If you have any questions.  Other tests that may be performed during your first trimester include:  Blood tests to find your blood type and to check for the presence of any previous infections. The tests will also be used to check for low iron levels (anemia) and protein on red blood cells (Rh antibodies). Depending on your risk factors, or if you previously had diabetes during pregnancy, you may have tests to check for high blood sugar that affects pregnant women (gestational diabetes).  Urine tests to check for infections, diabetes, or protein in the urine.  An ultrasound to confirm the proper growth and development of the baby.  Fetal screens for spinal cord problems (spina bifida) and Down syndrome.  HIV (human immunodeficiency virus) testing. Routine prenatal testing includes screening for HIV, unless you choose not to have this test.  You may need other tests to make sure you and the baby are doing well.  Follow these instructions at home: Medicines  Follow your health care provider's instructions regarding medicine use. Specific medicines may be either safe or unsafe to take during pregnancy.  Take a prenatal vitamin that contains at least 600 micrograms (mcg) of folic acid.  If you develop constipation, try taking a stool softener if your health care provider approves. Eating and drinking  Eat a balanced diet that includes fresh fruits and vegetables, whole grains, good sources of protein such as meat, eggs, or tofu, and low-fat dairy. Your health care provider will help you determine the amount of weight gain that is right for you.  Avoid raw meat and uncooked cheese. These carry germs that can cause birth defects in the baby.  Eating four or five small meals rather than three large meals a day may help  relieve nausea and vomiting. If you start to feel nauseous, eating a few soda crackers  can be helpful. Drinking liquids between meals, instead of during meals, also seems to help ease nausea and vomiting.  Limit foods that are high in fat and processed sugars, such as fried and sweet foods.  To prevent constipation: ? Eat foods that are high in fiber, such as fresh fruits and vegetables, whole grains, and beans. ? Drink enough fluid to keep your urine clear or pale yellow. Activity  Exercise only as directed by your health care provider. Most women can continue their usual exercise routine during pregnancy. Try to exercise for 30 minutes at least 5 days a week. Exercising will help you: ? Control your weight. ? Stay in shape. ? Be prepared for labor and delivery.  Experiencing pain or cramping in the lower abdomen or lower back is a good sign that you should stop exercising. Check with your health care provider before continuing with normal exercises.  Try to avoid standing for long periods of time. Move your legs often if you must stand in one place for a long time.  Avoid heavy lifting.  Wear low-heeled shoes and practice good posture.  You may continue to have sex unless your health care provider tells you not to. Relieving pain and discomfort  Wear a good support bra to relieve breast tenderness.  Take warm sitz baths to soothe any pain or discomfort caused by hemorrhoids. Use hemorrhoid cream if your health care provider approves.  Rest with your legs elevated if you have leg cramps or low back pain.  If you develop varicose veins in your legs, wear support hose. Elevate your feet for 15 minutes, 3-4 times a day. Limit salt in your diet. Prenatal care  Schedule your prenatal visits by the twelfth week of pregnancy. They are usually scheduled monthly at first, then more often in the last 2 months before delivery.  Write down your questions. Take them to your prenatal  visits.  Keep all your prenatal visits as told by your health care provider. This is important. Safety  Wear your seat belt at all times when driving.  Make a list of emergency phone numbers, including numbers for family, friends, the hospital, and police and fire departments. General instructions  Ask your health care provider for a referral to a local prenatal education class. Begin classes no later than the beginning of month 6 of your pregnancy.  Ask for help if you have counseling or nutritional needs during pregnancy. Your health care provider can offer advice or refer you to specialists for help with various needs.  Do not use hot tubs, steam rooms, or saunas.  Do not douche or use tampons or scented sanitary pads.  Do not cross your legs for long periods of time.  Avoid cat litter boxes and soil used by cats. These carry germs that can cause birth defects in the baby and possibly loss of the fetus by miscarriage or stillbirth.  Avoid all smoking, herbs, alcohol, and medicines not prescribed by your health care provider. Chemicals in these products affect the formation and growth of the baby.  Do not use any products that contain nicotine or tobacco, such as cigarettes and e-cigarettes. If you need help quitting, ask your health care provider. You may receive counseling support and other resources to help you quit.  Schedule a dentist appointment. At home, brush your teeth with a soft toothbrush and be gentle when you floss. Contact a health care provider if:  You have dizziness.  You have mild pelvic cramps, pelvic  pressure, or nagging pain in the abdominal area.  You have persistent nausea, vomiting, or diarrhea.  You have a bad smelling vaginal discharge.  You have pain when you urinate.  You notice increased swelling in your face, hands, legs, or ankles.  You are exposed to fifth disease or chickenpox.  You are exposed to [place] measles (rubella) and have never  had it. Get help right away if:  You have a fever.  You are leaking fluid from your vagina.  You have spotting or bleeding from your vagina.  You have severe abdominal cramping or pain.  You have rapid weight gain or loss.  You vomit blood or material that looks like coffee grounds.  You develop a severe headache.  You have shortness of breath.  You have any kind of trauma, such as from a fall or a car accident. Summary  The first trimester of pregnancy is from week 1 until the end of week 13 (months 1 through 3).  Your body goes through many changes during pregnancy. The changes vary from woman to woman.  You will have routine prenatal visits. During those visits, your health care provider will examine you, discuss any test results you may have, and talk with you about how you are feeling. This information is not intended to replace advice given to you by your health care provider. Make sure you discuss any questions you have with your health care provider. Document Released: 02/02/2001 Document Revised: 01/21/2016 Document Reviewed: 01/21/2016 Elsevier Interactive Patient Education  2018 [employer]. Commonly Asked Questions During Pregnancy  Cats: A parasite can be excreted in cat feces.  To avoid exposure you need to have another person empty the little box.  If you must empty the litter box you will need to wear gloves.  Wash your hands after handling your cat.  This parasite can also be found in raw or undercooked meat so this should also be avoided.  Colds, Sore Throats, Flu: Please check your medication sheet to see what you can take for symptoms.  If your symptoms are unrelieved by these medications please call the office.  Dental Work: Most any dental work [employer] recommends is permitted.  X-rays should only be taken during the first trimester if absolutely necessary.  Your abdomen should be shielded with a lead apron during all x-rays.  Please notify your  provider prior to receiving any x-rays.  Novocaine is fine; gas is not recommended.  If your dentist requires a note from [place] prior to dental work please call the office and we will provide one for you.  Exercise: Exercise is an important part of staying healthy during your pregnancy.  You may continue most exercises you were accustomed to prior to pregnancy.  Later in your pregnancy you will most likely notice you have difficulty with activities requiring balance like riding a bicycle.  It is important that you listen to your body and avoid activities that put you at a higher risk of falling.  Adequate rest and staying well hydrated are a must!  If you have questions about the safety of specific activities ask your provider.    Exposure to Children with illness: Try to avoid obvious exposure; report any symptoms to [place] when noted,  If you have chicken pos, red measles or mumps, you should be immune to these diseases.   Please do not take any vaccines while pregnant unless you have checked with your OB provider.  Fetal Movement: After 28 weeks we  recommend you do "kick counts" twice daily.  Lie or sit down in a calm quiet environment and count your baby movements "kicks".  You should feel your baby at least 10 times per hour.  If you have not felt 10 kicks within the first hour get up, walk around and have something sweet to eat or drink then repeat for an additional hour.  If count remains less than 10 per hour notify your provider.  Fumigating: Follow your pest control agent's advice as to how long to stay out of your home.  Ventilate the area well before re-entering.  Hemorrhoids:   Most over-the-counter preparations can be used during pregnancy.  Check your medication to see what is safe to use.  It is important to use a stool softener or fiber in your diet and to drink lots of liquids.  If hemorrhoids seem to be getting worse please call the office.   Hot Tubs:  Hot tubs Jacuzzis and saunas are not  recommended while pregnant.  These increase your internal body temperature and should be avoided.  Intercourse:  Sexual intercourse is safe during pregnancy as long as you are comfortable, unless otherwise advised by your provider.  Spotting may occur after intercourse; report any bright red bleeding that is heavier than spotting.  Labor:  If you know that you are in labor, please go to the hospital.  If you are unsure, please call the office and let us help you decide what to do.  Lifting, straining, etc:  If your job requires heavy lifting or straining please check with your provider for any limitations.  Generally, you should not lift items heavier than that you can lift simply with your hands and arms (no back muscles)  Painting:  Paint fumes do not harm your pregnancy, but may make you ill and should be avoided if possible.  Latex or water based paints have less odor than oils.  Use adequate ventilation while painting.  Permanents & Hair Color:  Chemicals in hair dyes are not recommended as they cause increase hair dryness which can increase hair loss during pregnancy.  " Highlighting" and permanents are allowed.  Dye may be absorbed differently and permanents may not hold as well during pregnancy.  Sunbathing:  Use a sunscreen, as skin burns easily during pregnancy.  Drink plenty of fluids; avoid over heating.  Tanning Beds:  Because their possible side effects are still unknown, tanning beds are not recommended.  Ultrasound Scans:  Routine ultrasounds are performed at approximately 20 weeks.  You will be able to see your baby's general anatomy an if you would like to know the gender this can usually be determined as well.  If it is questionable when you conceived you may also receive an ultrasound early in your pregnancy for dating purposes.  Otherwise ultrasound exams are not routinely performed unless there is a medical necessity.  Although you can request a scan we ask that you pay for it  when conducted because insurance does not cover " patient request" scans.  Work: If your pregnancy proceeds without complications you may work until your due date, unless your physician or employer advises otherwise.  Round Ligament Pain/Pelvic Discomfort:  Sharp, shooting pains not associated with bleeding are fairly common, usually occurring in the second trimester of pregnancy.  They tend to be worse when standing up or when you remain standing for long periods of time.  These are the result of pressure of certain pelvic ligaments called "round ligaments".  Rest, Tylenol and heat seem to be the most effective relief.  As the womb and fetus grow, they rise out of the pelvis and the discomfort improves.  Please notify the office if your pain seems different than that described.  It may represent a more serious condition.  Common Medications Safe in Pregnancy  Acne:      Constipation:  Benzoyl Peroxide     Colace  Clindamycin      Dulcolax Suppository  Topica Erythromycin     Fibercon  Salicylic Acid      Metamucil         Miralax AVOID:        Senakot   Accutane    Cough:  Retin-A       Cough Drops  Tetracycline      Phenergan w/ Codeine if Rx  Minocycline      Robitussin (Plain & DM)  Antibiotics:     Crabs/Lice:  Ceclor       RID  Cephalosporins    AVOID:  E-Mycins      Kwell  Keflex  Macrobid/Macrodantin   Diarrhea:  Penicillin      Kao-Pectate  Zithromax      Imodium AD         PUSH FLUIDS AVOID:       Cipro     Fever:  Tetracycline      Tylenol (Regular or Extra  Minocycline       Strength)  Levaquin      Extra Strength-Do not          Exceed 8 tabs/24 hrs Caffeine:        <231m/day (equiv. To 1 cup of coffee or  approx. 3 12 oz sodas)         Gas: Cold/Hayfever:       Gas-X  Benadryl      Mylicon  Claritin       Phazyme  **Claritin-D        Chlor-Trimeton    Headaches:  Dimetapp      ASA-Free Excedrin  Drixoral-Non-Drowsy     Cold Compress  Mucinex  (Guaifenasin)     Tylenol (Regular or Extra  Sudafed/Sudafed-12 Hour     Strength)  **Sudafed PE Pseudoephedrine   Tylenol Cold & Sinus     Vicks Vapor Rub  Zyrtec  **AVOID if Problems With Blood Pressure         Heartburn: Avoid lying down for at least 1 hour after meals  Aciphex      Maalox     Rash:  Milk of Magnesia     Benadryl    Mylanta       1% Hydrocortisone Cream  Pepcid  Pepcid Complete   Sleep Aids:  Prevacid      Ambien   Prilosec       Benadryl  Rolaids       Chamomile Tea  Tums (Limit 4/day)     Unisom  Zantac       Tylenol PM         Warm milk-add vanilla or  Hemorrhoids:       Sugar for taste  Anusol/Anusol H.C.  (RX: Analapram 2.5%)  Sugar Substitutes:  Hydrocortisone OTC     Ok in moderation  Preparation H      Tucks        Vaseline lotion applied to tissue with wiping    Herpes:     Throat:  Acyclovir  Oragel  Famvir  Valtrex     Vaccines:         Flu Shot Leg Cramps:       *Gardasil  Benadryl      Hepatitis A         Hepatitis B Nasal Spray:       Pneumovax  Saline Nasal Spray     Polio Booster         Tetanus Nausea:       Tuberculosis test or PPD  Vitamin B6 25 mg TID   AVOID:    Dramamine      *Gardasil  Emetrol       Live Poliovirus  Ginger Root 250 mg QID    MMR (measles, mumps &  High Complex Carbs @ Bedtime    rebella)  Sea Bands-Accupressure    Varicella (Chickenpox)  Unisom 1/2 tab TID     *No known complications           If received before Pain:         Known pregnancy;   Darvocet       Resume series after  Lortab        Delivery  Percocet    Yeast:   Tramadol      Femstat  Tylenol 3      Gyne-lotrimin  Ultram       Monistat  Vicodin           MISC:         All Sunscreens           Hair Coloring/highlights          Insect Repellant's          (Including DEET)         [facility]  [place], [place], [place] [address]  Phone: [phone]   [facility] (second location)  [address] [place], [place] [address]  Phone: [phone]   [facility] [place], [place], [place] [address] Phone: [phone]   [facility] [place], [place] [address]  Phone: [phone]

## 2017-07-29 NOTE — Progress Notes (Signed)
Leslie HeapSarah Elizabeth [facility]. Pregnancy confirmation done __at urgent care several weeks ago. Documentation was not obtain today. UPT in house done- pos. Unsure LMP.   G-4 .  P-1021.  Dating scan done today. Dating scan reveals SIUP at 5411 3/7. EDD- 02/14/2018.  Pregnancy education material explained and given. __Pos for _ cats in the home. Toxoplasma ordered. Advised pt to not change litter box. NOB labs ordered. Marland Kitchen. HIV labs and Drug screen were ordered. PNV encouraged. Genetic screening options discussed. Genetic testing: Unsure.  Pt may discuss with provider.  Pt did experience n/v up until 2 weeks ago. Last pap 2016. Pos MJ user. Last used 2 weeks ago. Advised pt to not use MJ during pregnancy. Financial policy reviewed. FMLA form discussed and signed. Pt. To follow up with provider asap for NOB physical.  All questions answered.

## 2017-07-30 LAB — CBC WITH DIFFERENTIAL/PLATELET
BASOS: 0 %
Basophils Absolute: 0 10*3/uL (ref 0.0–0.2)
EOS (ABSOLUTE): 0.2 10*3/uL (ref 0.0–0.4)
EOS: 2 %
Hematocrit: 37.5 % (ref 34.0–46.6)
Hemoglobin: 13 g/dL (ref 11.1–15.9)
Immature Grans (Abs): 0.1 10*3/uL (ref 0.0–0.1)
Immature Granulocytes: 1 %
LYMPHS ABS: 2.7 10*3/uL (ref 0.7–3.1)
Lymphs: 25 %
MCH: 31.6 pg (ref 26.6–33.0)
MCHC: 34.7 g/dL (ref 31.5–35.7)
MCV: 91 fL (ref 79–97)
MONOS ABS: 0.7 10*3/uL (ref 0.1–0.9)
Monocytes: 7 %
NEUTROS ABS: 6.9 10*3/uL (ref 1.4–7.0)
NEUTROS PCT: 65 %
Platelets: 271 10*3/uL (ref 150–450)
RBC: 4.11 x10E6/uL (ref 3.77–5.28)
RDW: 12.9 % (ref 12.3–15.4)
WBC: 10.6 10*3/uL (ref 3.4–10.8)

## 2017-07-30 LAB — ANTIBODY SCREEN: Antibody Screen: NEGATIVE

## 2017-07-30 LAB — RPR: RPR Ser Ql: NONREACTIVE

## 2017-07-30 LAB — URINALYSIS, ROUTINE W REFLEX MICROSCOPIC
BILIRUBIN UA: NEGATIVE
Glucose, UA: NEGATIVE
Ketones, UA: NEGATIVE
Leukocytes, UA: NEGATIVE
Nitrite, UA: NEGATIVE
PH UA: 7 (ref 5.0–7.5)
Protein, UA: NEGATIVE
RBC UA: NEGATIVE
Specific Gravity, UA: 1.015 (ref 1.005–1.030)
Urobilinogen, Ur: 0.2 mg/dL (ref 0.2–1.0)

## 2017-07-30 LAB — TOXOPLASMA ANTIBODIES- IGG AND  IGM
TOXOPLASMA IGG RATIO: 29.5 [IU]/mL — AB (ref 0.0–7.1)
Toxoplasma Antibody- IgM: <3 AU/mL (ref 0.0–7.9)

## 2017-07-30 LAB — RUBELLA SCREEN: RUBELLA: 1.77 {index} (ref >0.99)

## 2017-07-30 LAB — HIV ANTIBODY (ROUTINE TESTING W REFLEX): HIV SCREEN 4TH GENERATION: NONREACTIVE

## 2017-07-30 LAB — HEPATITIS B SURFACE ANTIGEN: HEP B S AG: NEGATIVE

## 2017-07-30 LAB — ABO AND RH: RH TYPE: POSITIVE

## 2017-07-30 LAB — VARICELLA ZOSTER ANTIBODY, IGG: Varicella zoster IgG: 1290 index (ref >165)

## 2017-07-31 LAB — GC/CHLAMYDIA PROBE AMP
Chlamydia trachomatis, NAA: NEGATIVE
Neisseria gonorrhoeae by PCR: NEGATIVE

## 2017-08-01 ENCOUNTER — Ambulatory Visit (INDEPENDENT_AMBULATORY_CARE_PROVIDER_SITE_OTHER): Payer: 59 | Admitting: Obstetrics and Gynecology

## 2017-08-01 ENCOUNTER — Encounter: Payer: Self-pay | Admitting: Obstetrics and Gynecology

## 2017-08-01 ENCOUNTER — Other Ambulatory Visit: Payer: 59

## 2017-08-01 DIAGNOSIS — Z3491 Encounter for supervision of normal pregnancy, unspecified, first trimester: Secondary | ICD-10-CM | POA: Insufficient documentation

## 2017-08-01 DIAGNOSIS — Z3481 Encounter for supervision of other normal pregnancy, first trimester: Secondary | ICD-10-CM

## 2017-08-01 LAB — POCT URINALYSIS DIPSTICK
BILIRUBIN UA: NEGATIVE
Glucose, UA: NEGATIVE
KETONES UA: NEGATIVE
Leukocytes, UA: NEGATIVE
NITRITE UA: NEGATIVE
Odor: NEGATIVE
PH UA: 6 (ref 5.0–8.0)
Protein, UA: NEGATIVE
RBC UA: NEGATIVE
Spec Grav, UA: <=1.005 — AB (ref 1.010–1.025)
UROBILINOGEN UA: 0.2 U/dL

## 2017-08-01 LAB — CULTURE, OB URINE

## 2017-08-01 LAB — URINE CULTURE, OB REFLEX

## 2017-08-01 NOTE — Progress Notes (Signed)
NOB: Patient without complaint.  Taking prenatal vitamins as prescribed.  She had had a previous cesarean delivery of an 8 pound 8 ounce baby for failure to dilate.  She believes that she wants a repeat cesarean delivery.  Positive toxoplasmosis discussed. Pap performed  Physical examination General NAD, Conversant  HEENT Atraumatic; Op clear with mmm.  Normo-cephalic. Pupils reactive. Anicteric sclerae  Thyroid/Neck Smooth without nodularity or enlargement. Normal ROM.  Neck Supple.  Skin No rashes, lesions or ulceration. Normal palpated skin turgor. No nodularity.  Breasts: No masses or discharge.  Symmetric.  No axillary adenopathy.  Lungs: Clear to auscultation.No rales or wheezes. Normal Respiratory effort, no retractions.  Heart: NSR.  No murmurs or rubs appreciated. No periferal edema  Abdomen: Soft.  Non-tender.  No masses.  No HSM. No hernia  Extremities: Moves all appropriately.  Normal ROM for age. No lymphadenopathy.  Neuro: Oriented to PPT.  Normal mood. Normal affect.     Pelvic:   Vulva: Normal appearance.  No lesions.  Vagina: No lesions or abnormalities noted.  Support: Normal pelvic support.  Urethra No masses tenderness or scarring.  Meatus Normal size without lesions or prolapse.  Cervix: Normal appearance.  No lesions.  Anus: Normal exam.  No lesions.  Perineum: Normal exam.  No lesions.        Bimanual   Adnexae: No masses.  Non-tender to palpation.  Uterus: Enlarged. 12 wks POS FHTs 161  Non-tender.  Mobile.  AV.  Adnexae: No masses.  Non-tender to palpation.  Cul-de-sac: Negative for abnormality.  Adnexae: No masses.  Non-tender to palpation.         Pelvimetry   Diagonal: Reached.  Spines: Average.  Sacrum: Concave.  Pubic Arch: NARROW   Patient considering genetic testing-we will check with her insurance company and get back to us when she has reached a decision.

## 2017-08-01 NOTE — Progress Notes (Signed)
Nob Pe. Pap due. Undecided about genetic testing.

## 2017-08-03 LAB — IGP, COBASHPV16/18
HPV 16: NEGATIVE
HPV 18: NEGATIVE
HPV other hr types: NEGATIVE
PAP Smear Comment: 0

## 2017-08-06 LAB — DRUG PROFILE, UR, 9 DRUGS (LABCORP)
Amphetamines, Urine: NEGATIVE ng/mL
Barbiturate Quant, Ur: NEGATIVE ng/mL
Benzodiazepine Quant, Ur: NEGATIVE ng/mL
COCAINE (METAB.): NEGATIVE ng/mL
Cannabinoid Quant, Ur: POSITIVE — AB
METHADONE SCREEN, URINE: NEGATIVE ng/mL
OPIATE QUANT UR: NEGATIVE ng/mL
PCP Quant, Ur: NEGATIVE ng/mL
PROPOXYPHENE: NEGATIVE ng/mL

## 2017-08-06 LAB — NICOTINE SCREEN, URINE: Cotinine Ql Scrn, Ur: NEGATIVE ng/mL

## 2017-08-08 ENCOUNTER — Encounter: Payer: Self-pay | Admitting: Family Medicine

## 2017-08-11 ENCOUNTER — Encounter: Payer: Self-pay | Admitting: Obstetrics and Gynecology

## 2017-08-12 ENCOUNTER — Ambulatory Visit: Payer: Self-pay | Admitting: Family Medicine

## 2017-08-16 ENCOUNTER — Encounter: Payer: Self-pay | Admitting: Obstetrics and Gynecology

## 2017-08-29 ENCOUNTER — Encounter: Payer: Self-pay | Admitting: Obstetrics and Gynecology

## 2017-08-29 ENCOUNTER — Encounter: Payer: 59 | Admitting: Obstetrics and Gynecology

## 2017-08-29 ENCOUNTER — Ambulatory Visit (INDEPENDENT_AMBULATORY_CARE_PROVIDER_SITE_OTHER): Payer: 59 | Admitting: Obstetrics and Gynecology

## 2017-08-29 VITALS — BP 103/68 | HR 92 | Wt 179.6 lb

## 2017-08-29 DIAGNOSIS — Z3482 Encounter for supervision of other normal pregnancy, second trimester: Secondary | ICD-10-CM

## 2017-08-29 LAB — POCT URINALYSIS DIPSTICK
BILIRUBIN UA: NEGATIVE
Blood, UA: NEGATIVE
Glucose, UA: NEGATIVE
Ketones, UA: NEGATIVE
Leukocytes, UA: NEGATIVE
NITRITE UA: NEGATIVE
ODOR: NEGATIVE
PH UA: 6.5 (ref 5.0–8.0)
Protein, UA: NEGATIVE
Spec Grav, UA: 1.02 (ref 1.010–1.025)
Urobilinogen, UA: 0.2 E.U./dL

## 2017-08-29 NOTE — Progress Notes (Signed)
Rob- pos for h/a. 3 x a week. Tylenol 2 es prn. Excedrin only occas. Approved for preg medicaid. Will order materni 21 today.

## 2017-08-29 NOTE — Progress Notes (Signed)
ROB: Patient desires repeat cesarean delivery.  Doing well.  Desires genetic testing and AFP today.  FAS ultrasound with next visit.

## 2017-08-31 LAB — AFP, SERUM, OPEN SPINA BIFIDA
AFP MOM: 1.31
AFP VALUE AFPOSL: 33.3 ng/mL
Gest. Age on Collection Date: 15 weeks
Maternal Age At EDD: 32.5 yr
OSBR RISK 1 IN: 4664
TEST RESULTS AFP: NEGATIVE
Weight: 179 [lb_av]

## 2017-09-02 ENCOUNTER — Telehealth: Payer: Self-pay

## 2017-09-02 LAB — MATERNIT21  PLUS CORE+ESS+SCA, BLOOD
CHROMOSOME 18: NEGATIVE
Chromosome 13: NEGATIVE
Chromosome 21: NEGATIVE
Y Chromosome: NOT DETECTED

## 2017-09-02 NOTE — Telephone Encounter (Signed)
[facility]   [phone]  Called but the system is currently down. She is to call office back in an hour.

## 2017-09-13 ENCOUNTER — Other Ambulatory Visit: Payer: Self-pay | Admitting: Obstetrics and Gynecology

## 2017-09-13 DIAGNOSIS — Z3689 Encounter for other specified antenatal screening: Secondary | ICD-10-CM

## 2017-09-27 ENCOUNTER — Other Ambulatory Visit: Payer: Self-pay | Admitting: Obstetrics and Gynecology

## 2017-09-27 ENCOUNTER — Ambulatory Visit (INDEPENDENT_AMBULATORY_CARE_PROVIDER_SITE_OTHER): Payer: 59

## 2017-09-27 ENCOUNTER — Ambulatory Visit (INDEPENDENT_AMBULATORY_CARE_PROVIDER_SITE_OTHER): Payer: 59 | Admitting: Obstetrics and Gynecology

## 2017-09-27 ENCOUNTER — Encounter: Payer: Self-pay | Admitting: Obstetrics and Gynecology

## 2017-09-27 VITALS — BP 109/71 | HR 99 | Wt 188.0 lb

## 2017-09-27 DIAGNOSIS — Z3689 Encounter for other specified antenatal screening: Secondary | ICD-10-CM

## 2017-09-27 DIAGNOSIS — Z363 Encounter for antenatal screening for malformations: Secondary | ICD-10-CM | POA: Diagnosis not present

## 2017-09-27 DIAGNOSIS — Z3482 Encounter for supervision of other normal pregnancy, second trimester: Secondary | ICD-10-CM

## 2017-09-27 DIAGNOSIS — IMO0002 Reserved for concepts with insufficient information to code with codable children: Secondary | ICD-10-CM

## 2017-09-27 DIAGNOSIS — Z0489 Encounter for examination and observation for other specified reasons: Secondary | ICD-10-CM

## 2017-09-27 LAB — POCT URINALYSIS DIPSTICK
Bilirubin, UA: NEGATIVE
GLUCOSE UA: NEGATIVE
KETONES UA: NEGATIVE
LEUKOCYTES UA: NEGATIVE
Nitrite, UA: NEGATIVE
Protein, UA: NEGATIVE
RBC UA: NEGATIVE
SPEC GRAV UA: 1.015 (ref 1.010–1.025)
Urobilinogen, UA: 0.2 E.U./dL
pH, UA: 6 (ref 5.0–8.0)

## 2017-09-27 NOTE — Progress Notes (Signed)
Pt has no concerns at this time. 

## 2017-09-27 NOTE — Progress Notes (Signed)
ROB: Patient doing well.  Ultrasound today for anatomy.  Remains incomplete.  Patient not feeling fetal movement yet.

## 2017-10-11 ENCOUNTER — Ambulatory Visit (INDEPENDENT_AMBULATORY_CARE_PROVIDER_SITE_OTHER): Payer: 59

## 2017-10-11 DIAGNOSIS — Z3481 Encounter for supervision of other normal pregnancy, first trimester: Secondary | ICD-10-CM

## 2017-10-11 DIAGNOSIS — IMO0002 Reserved for concepts with insufficient information to code with codable children: Secondary | ICD-10-CM

## 2017-10-11 DIAGNOSIS — Z0489 Encounter for examination and observation for other specified reasons: Secondary | ICD-10-CM

## 2017-10-11 DIAGNOSIS — Z3A21 21 weeks gestation of pregnancy: Secondary | ICD-10-CM | POA: Diagnosis not present

## 2017-10-25 ENCOUNTER — Encounter: Payer: 59 | Admitting: Obstetrics and Gynecology

## 2017-10-27 ENCOUNTER — Ambulatory Visit (INDEPENDENT_AMBULATORY_CARE_PROVIDER_SITE_OTHER): Payer: 59 | Admitting: Obstetrics and Gynecology

## 2017-10-27 VITALS — BP 107/70 | HR 83 | Wt 191.2 lb

## 2017-10-27 DIAGNOSIS — Z23 Encounter for immunization: Secondary | ICD-10-CM | POA: Diagnosis not present

## 2017-10-27 DIAGNOSIS — Z8619 Personal history of other infectious and parasitic diseases: Secondary | ICD-10-CM | POA: Insufficient documentation

## 2017-10-27 DIAGNOSIS — O34219 Maternal care for unspecified type scar from previous cesarean delivery: Secondary | ICD-10-CM

## 2017-10-27 DIAGNOSIS — Z3482 Encounter for supervision of other normal pregnancy, second trimester: Secondary | ICD-10-CM

## 2017-10-27 DIAGNOSIS — Z131 Encounter for screening for diabetes mellitus: Secondary | ICD-10-CM

## 2017-10-27 DIAGNOSIS — Z130 Encounter for screening for diseases of the blood and blood-forming organs and certain disorders involving the immune mechanism: Secondary | ICD-10-CM

## 2017-10-27 DIAGNOSIS — Z98891 History of uterine scar from previous surgery: Secondary | ICD-10-CM | POA: Insufficient documentation

## 2017-10-27 LAB — POCT URINALYSIS DIPSTICK OB
Bilirubin, UA: NEGATIVE
Glucose, UA: NEGATIVE
Ketones, UA: NEGATIVE
NITRITE UA: NEGATIVE
PROTEIN: NEGATIVE
RBC UA: NEGATIVE
UROBILINOGEN UA: 0.2 U/dL
pH, UA: 7 (ref 5.0–8.0)

## 2017-10-27 NOTE — Progress Notes (Signed)
ROB: Doing well, no complaints. Discussed desires for C-section further, all questions answered.  For flu vaccine today.  RTC in 4 weeks, for 28 week labs at that time.

## 2017-10-27 NOTE — Progress Notes (Signed)
ROB- PT stated that she is doing well. No complaints.   

## 2017-11-23 NOTE — Progress Notes (Signed)
Pt presents today for ROB. Pt is doing well and has no concerns.

## 2017-11-24 ENCOUNTER — Encounter: Payer: Self-pay | Admitting: Obstetrics and Gynecology

## 2017-11-24 ENCOUNTER — Other Ambulatory Visit: Payer: 59

## 2017-11-24 ENCOUNTER — Ambulatory Visit (INDEPENDENT_AMBULATORY_CARE_PROVIDER_SITE_OTHER): Payer: 59 | Admitting: Obstetrics and Gynecology

## 2017-11-24 VITALS — BP 100/66 | HR 90 | Wt 195.0 lb

## 2017-11-24 DIAGNOSIS — Z3482 Encounter for supervision of other normal pregnancy, second trimester: Secondary | ICD-10-CM

## 2017-11-24 DIAGNOSIS — Z131 Encounter for screening for diabetes mellitus: Secondary | ICD-10-CM

## 2017-11-24 DIAGNOSIS — Z130 Encounter for screening for diseases of the blood and blood-forming organs and certain disorders involving the immune mechanism: Secondary | ICD-10-CM

## 2017-11-24 DIAGNOSIS — Z98891 History of uterine scar from previous surgery: Secondary | ICD-10-CM

## 2017-11-24 DIAGNOSIS — Z3483 Encounter for supervision of other normal pregnancy, third trimester: Secondary | ICD-10-CM

## 2017-11-24 LAB — POCT URINALYSIS DIPSTICK OB
BILIRUBIN UA: NEGATIVE
Glucose, UA: NEGATIVE
KETONES UA: NEGATIVE
NITRITE UA: NEGATIVE
PH UA: 7 (ref 5.0–8.0)
PROTEIN: NEGATIVE
RBC UA: NEGATIVE
Spec Grav, UA: 1.01 (ref 1.010–1.025)
UROBILINOGEN UA: 0.2 U/dL

## 2017-11-24 MED ORDER — TETANUS-DIPHTH-ACELL PERTUSSIS 5-2.5-18.5 LF-MCG/0.5 IM SUSP
0.5000 mL | Freq: Once | INTRAMUSCULAR | Status: AC
  Administered 2017-11-24: 0.5 mL via INTRAMUSCULAR

## 2017-11-24 NOTE — Progress Notes (Signed)
ROB: No complaints.  1 hour GCT today.  Has reaffirmed her desire for cesarean delivery.

## 2017-11-25 LAB — GLUCOSE, 1 HOUR GESTATIONAL: Gestational Diabetes Screen: 108 mg/dL (ref 65–139)

## 2017-11-25 LAB — CBC
HEMOGLOBIN: 11.1 g/dL (ref 11.1–15.9)
Hematocrit: 32.3 % — ABNORMAL LOW (ref 34.0–46.6)
MCH: 32.7 pg (ref 26.6–33.0)
MCHC: 34.4 g/dL (ref 31.5–35.7)
MCV: 95 fL (ref 79–97)
Platelets: 243 10*3/uL (ref 150–450)
RBC: 3.39 x10E6/uL — AB (ref 3.77–5.28)
RDW: 12.7 % (ref 12.3–15.4)
WBC: 12.8 10*3/uL — ABNORMAL HIGH (ref 3.4–10.8)

## 2017-11-25 LAB — RPR: RPR Ser Ql: NONREACTIVE

## 2017-12-20 ENCOUNTER — Encounter: Payer: Self-pay | Admitting: Obstetrics and Gynecology

## 2017-12-20 ENCOUNTER — Ambulatory Visit (INDEPENDENT_AMBULATORY_CARE_PROVIDER_SITE_OTHER): Payer: 59 | Admitting: Obstetrics and Gynecology

## 2017-12-20 VITALS — BP 94/63 | HR 91 | Wt 201.8 lb

## 2017-12-20 DIAGNOSIS — Z308 Encounter for other contraceptive management: Secondary | ICD-10-CM

## 2017-12-20 DIAGNOSIS — Z98891 History of uterine scar from previous surgery: Secondary | ICD-10-CM

## 2017-12-20 DIAGNOSIS — O34219 Maternal care for unspecified type scar from previous cesarean delivery: Secondary | ICD-10-CM

## 2017-12-20 DIAGNOSIS — Z3483 Encounter for supervision of other normal pregnancy, third trimester: Secondary | ICD-10-CM

## 2017-12-20 LAB — POCT URINALYSIS DIPSTICK OB
Bilirubin, UA: NEGATIVE
Blood, UA: NEGATIVE
Glucose, UA: NEGATIVE
KETONES UA: NEGATIVE
NITRITE UA: NEGATIVE
Spec Grav, UA: 1.02 (ref 1.010–1.025)
Urobilinogen, UA: 0.2 E.U./dL
pH, UA: 6.5 (ref 5.0–8.0)

## 2017-12-20 NOTE — Patient Instructions (Signed)
Postpartum Tubal Ligation Postpartum tubal ligation (PPTL) is a procedure to close the fallopian tubes. This is done so that you cannot get pregnant. When the fallopian tubes are closed, the eggs that the ovaries release cannot enter the uterus, and sperm cannot reach the eggs. PPTL is done right after childbirth or 1-2 days after childbirth, before the uterus returns to its normal location. PPTL is sometimes called "getting your tubes tied." You should not have this procedure if you want to get pregnant someday or if you are unsure about having more children. Tell a health care provider about:  Any allergies you have.  All medicines you are taking, including vitamins, herbs, eye drops, creams, and over-the-counter medicines.  Previous problems you or members of your family have had with the use of anesthetics.  Any blood disorders you have.  Previous surgeries you have had.  Any medical conditions you may have.  Any past pregnancies. What are the risks? Generally, this is a safe procedure. However, problems may occur, including:  Infection.  Bleeding.  Injury to surrounding organs.  Side effects from anesthetics.  Failure of the procedure.  This procedure can increase your risk of a kind of pregnancy in which a fertilized egg attaches to the outside of the uterus (ectopic pregnancy). What happens before the procedure?  Ask your health care provider about: ? How much pain you can expect to have. ? What medicines you will be given for pain, especially if you are planning to breastfeed.  Follow instructions from your health care provider about eating and drinking restrictions. What happens during the procedure? If you had a vaginal delivery:  You may be given one or more of the following: ? A medicine that helps you relax (sedative). ? A medicine to numb the area (local anesthetic). ? A medicine to make you fall asleep (general anesthetic). ? A medicine that is injected  into an area of your body to numb everything below the injection site (regional anesthetic).  If you have been given a general anesthetic, a tube will be put down your throat to help you breathe.  An IV tube will be inserted into one of your veins to give you medicines and fluids during the procedure.  Your bladder may be emptied with a small tube (catheter).  An incision will be made just below your belly button.  Your fallopian tubes will be located and brought up through the incision.  Your fallopian tubes will be tied off, burned (cauterized), or blocked with a clip, ring, or clamp. A small portion in the center of each fallopian tube may be removed.  The incision will be closed with stitches (sutures).  A bandage (dressing) will be placed over the incision.  If you had a cesarean delivery:  Tubal ligation will be done through the incision that was used for the cesarean delivery of your baby.  The incision will be closed with sutures.  A dressing will be placed over the incision.  The procedure may vary among health care providers and hospitals. What happens after the procedure?  Your blood pressure, heart rate, breathing rate, and blood oxygen level will be monitored often until the medicines you were given have worn off.  You will be given pain medicine as needed.  Do not drive for 24 hours if you received a sedative. This information is not intended to replace advice given to you by your health care provider. Make sure you discuss any questions you have with your health   care provider. Document Released: 02/08/2005 Document Revised: 07/14/2015 Document Reviewed: 01/19/2015 Elsevier Interactive Patient Education  2018 Elsevier Inc. Third Trimester of Pregnancy The third trimester is from week 29 through week 42, months 7 through 9. This trimester is when your unborn baby (fetus) is growing very fast. At the end of the ninth month, the unborn baby is about 20 inches in  length. It weighs about 6-10 pounds. Follow these instructions at home:  Avoid all smoking, herbs, and alcohol. Avoid drugs not approved by your doctor.  Do not use any tobacco products, including cigarettes, chewing tobacco, and electronic cigarettes. If you need help quitting, ask your doctor. You may get counseling or other support to help you quit.  Only take medicine as told by your doctor. Some medicines are safe and some are not during pregnancy.  Exercise only as told by your doctor. Stop exercising if you start having cramps.  Eat regular, healthy meals.  Wear a good support bra if your breasts are tender.  Do not use hot tubs, steam rooms, or saunas.  Wear your seat belt when driving.  Avoid raw meat, uncooked cheese, and liter boxes and soil used by cats.  Take your prenatal vitamins.  Take 1500-2000 milligrams of calcium daily starting at the 20th week of pregnancy until you deliver your baby.  Try taking medicine that helps you poop (stool softener) as needed, and if your doctor approves. Eat more fiber by eating fresh fruit, vegetables, and whole grains. Drink enough fluids to keep your pee (urine) clear or pale yellow.  Take warm water baths (sitz baths) to soothe pain or discomfort caused by hemorrhoids. Use hemorrhoid cream if your doctor approves.  If you have puffy, bulging veins (varicose veins), wear support hose. Raise (elevate) your feet for 15 minutes, 3-4 times a day. Limit salt in your diet.  Avoid heavy lifting, wear low heels, and sit up straight.  Rest with your legs raised if you have leg cramps or low back pain.  Visit your dentist if you have not gone during your pregnancy. Use a soft toothbrush to brush your teeth. Be gentle when you floss.  You can have sex (intercourse) unless your doctor tells you not to.  Do not travel far distances unless you must. Only do so with your doctor's approval.  Take prenatal classes.  Practice driving to the  hospital.  Pack your hospital bag.  Prepare the baby's room.  Go to your doctor visits. Get help if:  You are not sure if you are in labor or if your water has broken.  You are dizzy.  You have mild cramps or pressure in your lower belly (abdominal).  You have a nagging pain in your belly area.  You continue to feel sick to your stomach (nauseous), throw up (vomit), or have watery poop (diarrhea).  You have bad smelling fluid coming from your vagina.  You have pain with peeing (urination). Get help right away if:  You have a fever.  You are leaking fluid from your vagina.  You are spotting or bleeding from your vagina.  You have severe belly cramping or pain.  You lose or gain weight rapidly.  You have trouble catching your breath and have chest pain.  You notice sudden or extreme puffiness (swelling) of your face, hands, ankles, feet, or legs.  You have not felt the baby move in over an hour.  You have severe headaches that do not go away with medicine.  You   have vision changes. This information is not intended to replace advice given to you by your health care provider. Make sure you discuss any questions you have with your health care provider. Document Released: 05/05/2009 Document Revised: 07/17/2015 Document Reviewed: 04/11/2012 Elsevier Interactive Patient Education  2017 Elsevier Inc.  

## 2017-12-20 NOTE — Progress Notes (Signed)
   ROB-PT stated that she is doing well no complaints.    

## 2017-12-20 NOTE — Progress Notes (Signed)
ROB: doing well, no complaints.  Desires to discuss tubal ligation at the time of her C-section.  Tubal ligation counseling performed:  Other reversible forms of contraception were discussed with patient; she declines all other modalities. Risks of procedure discussed with patient including but not limited to: risk of regret, permanence of method, bleeding, infection, injury to surrounding organs and need for additional procedures.  Failure risk of about 1% with increased risk of ectopic gestation if pregnancy occurs was also discussed with patient.  Patient verbalized understanding of these risks and wants to proceed with sterilization.  Medicaid tubaql ligation form signed today. Also discussed scheduling c-section, desired date is December 20th (or 19th). Will schedule as available. Medicaid Home Pregnancy form completed. RTC in 2 weeks.

## 2017-12-21 IMAGING — US US PELVIS COMPLETE
1 series · 14 of 25 positions shown · non-contrast
Comparison: Pelvic ultrasound performed 09/27/2013

CLINICAL DATA: Acute onset of left pelvic pain.  Initial encounter.

EXAM:
TRANSABDOMINAL AND TRANSVAGINAL ULTRASOUND OF PELVIS
TECHNIQUE: Both transabdominal and transvaginal ultrasound examinations of the
pelvis were performed. Transabdominal technique was performed for
global imaging of the pelvis including uterus, ovaries, adnexal
regions, and pelvic cul-de-sac. It was necessary to proceed with
endovaginal exam following the transabdominal exam to visualize the
uterus and ovaries in greater detail.

[Series 1: us pelvis complete · 0.21mm/px · 14 of 65 slices shown]
[im 1/65]
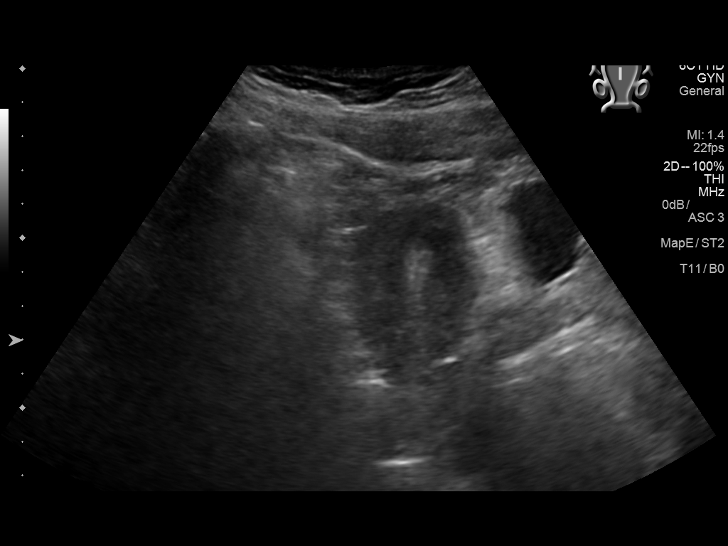
[im 6/65]
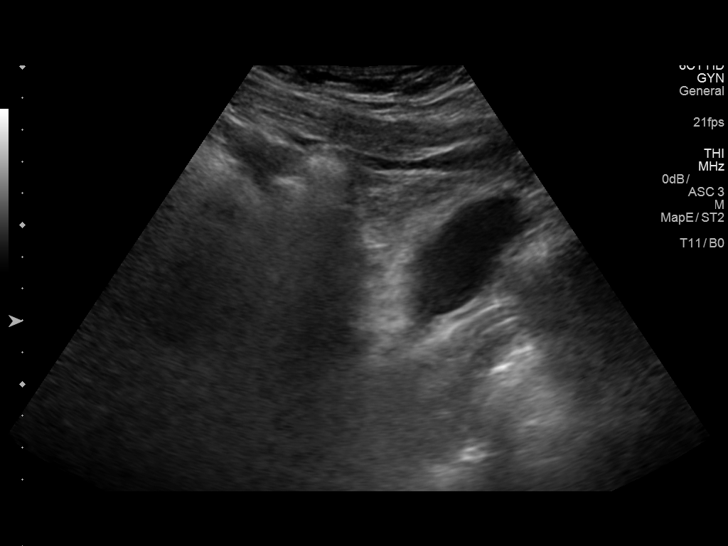
[im 11/65]
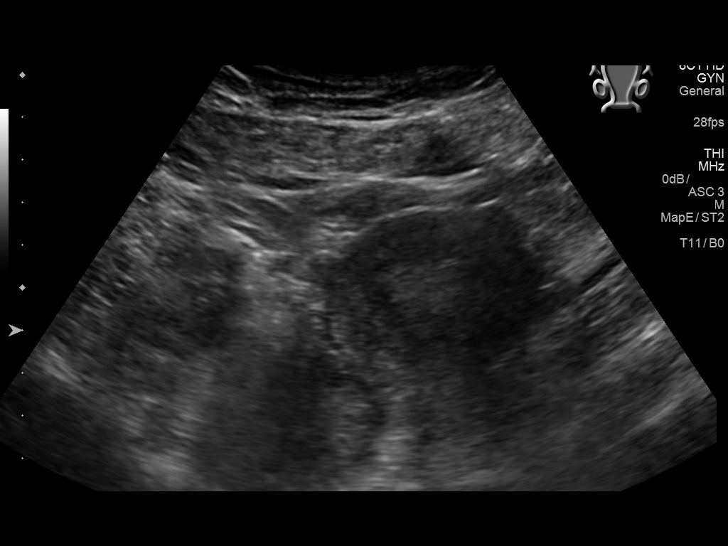
[im 17/65]
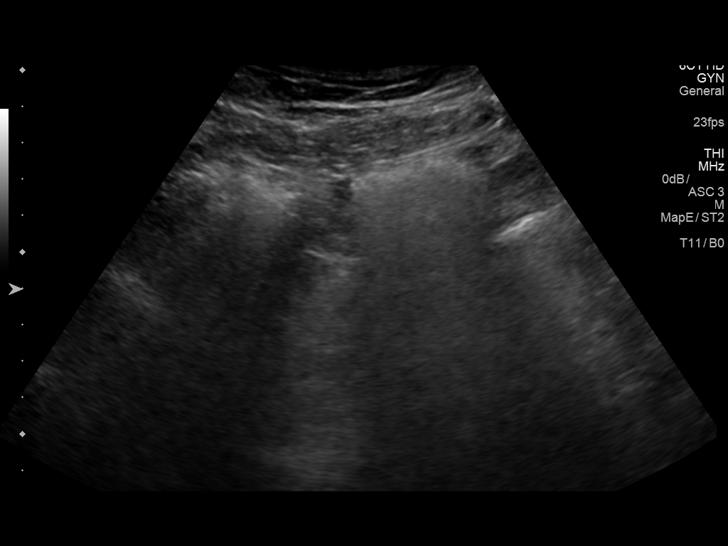
[im 22/65]
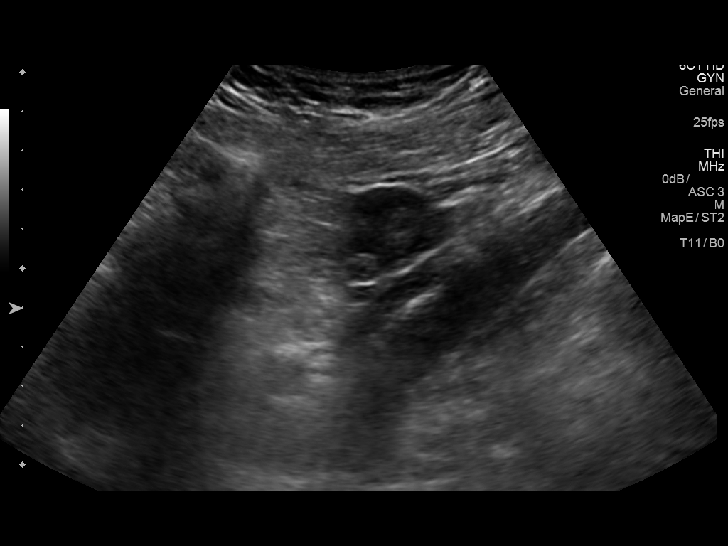
[im 25/65]
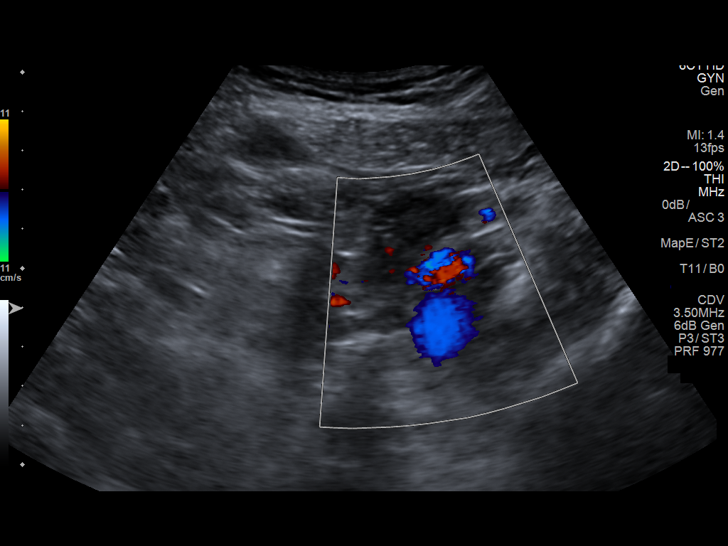
[im 30/65]
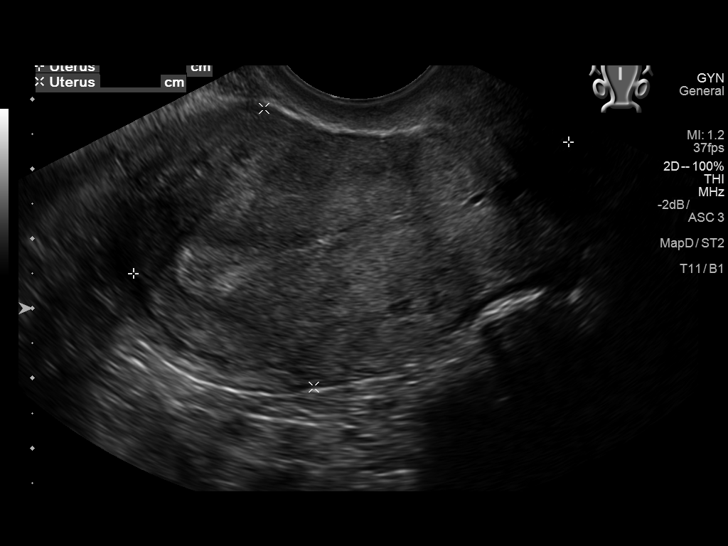
[im 35/65]
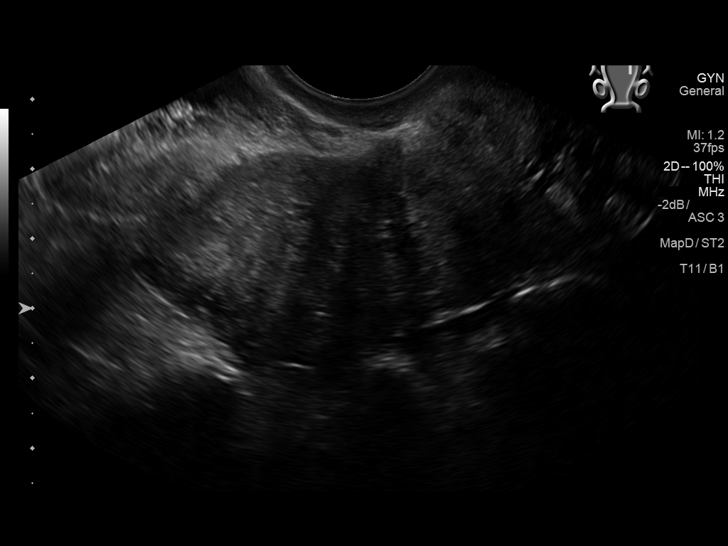
[im 41/65]
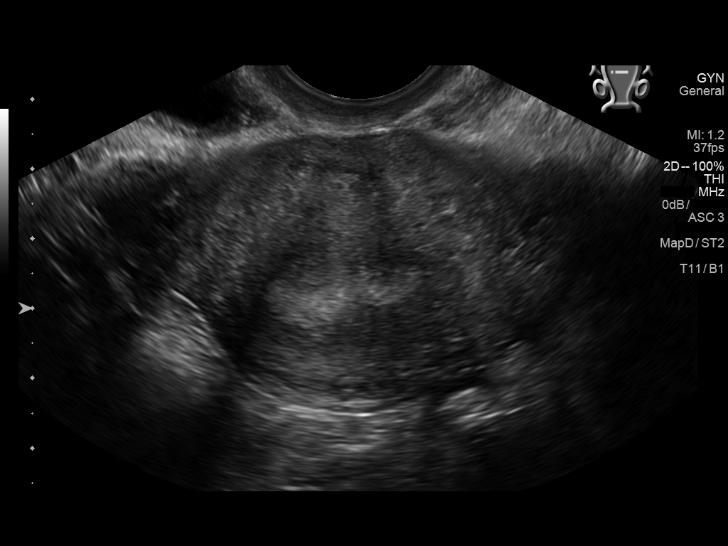
[im 43/65]
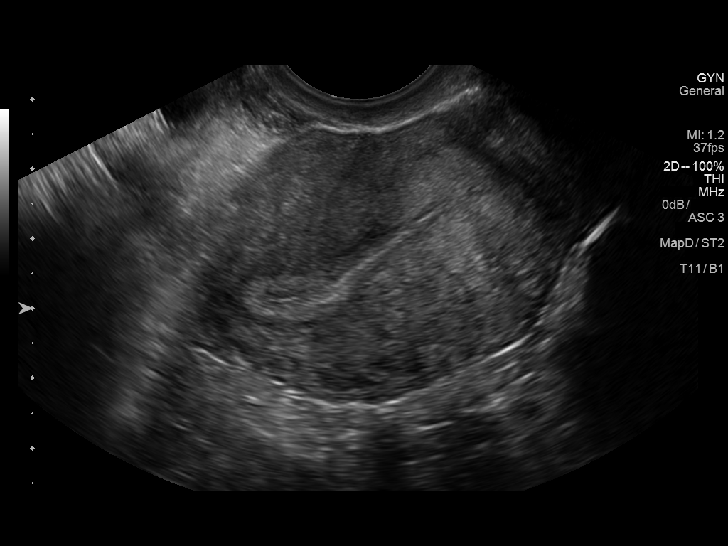
[im 49/65]
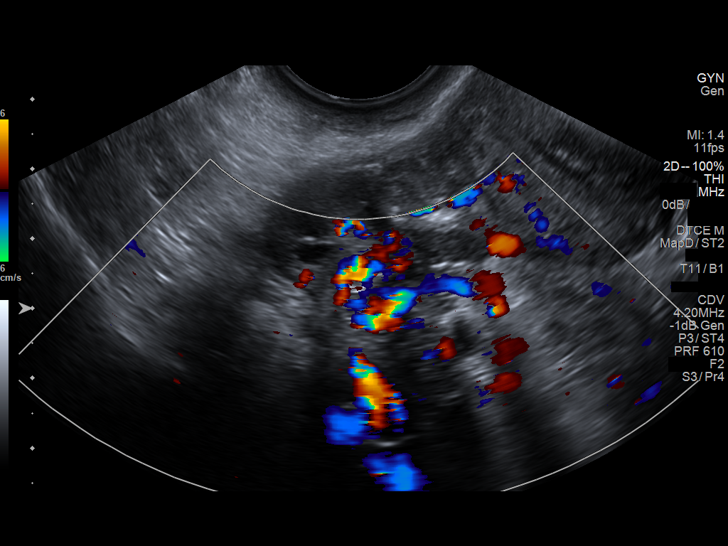
[im 54/65]
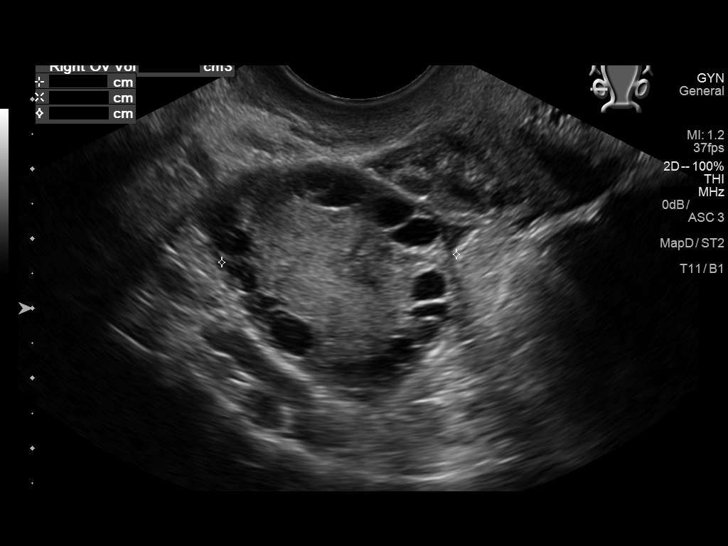
[im 59/65]
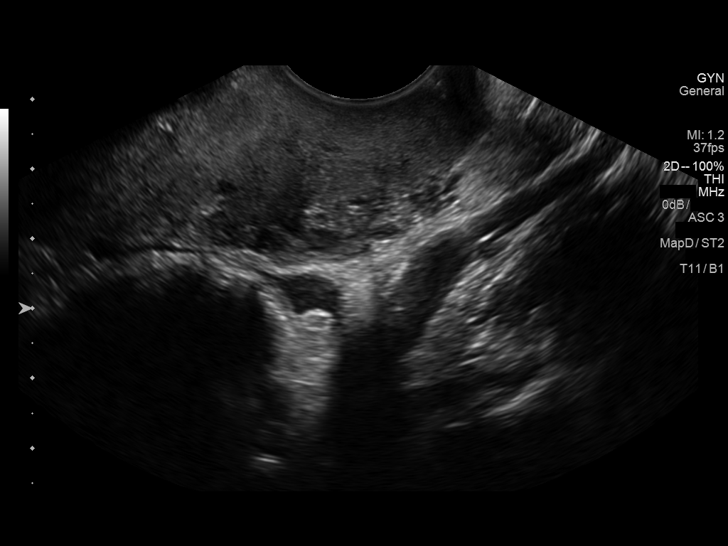
[im 65/65]
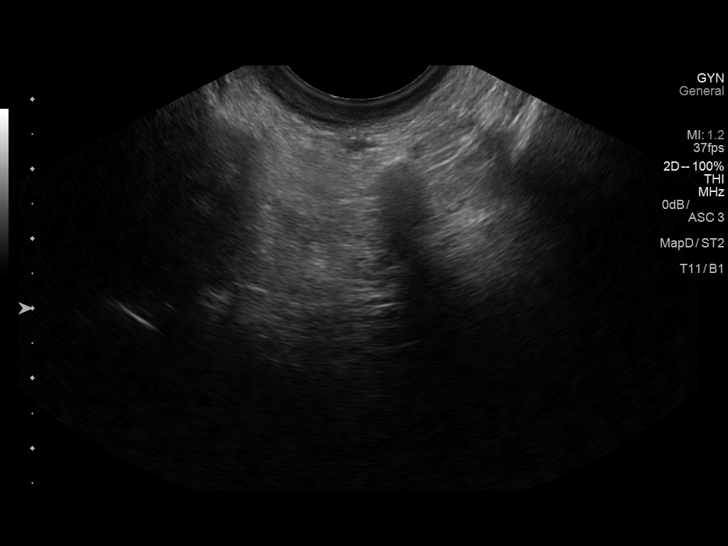

[14 of 25 positions shown; findings below may reference images not displayed]

FINDINGS: Uterus

Measurements: 7.9 x 4.2 x 5.1 cm. No fibroids or other mass
visualized.

Endometrium

Thickness: 0.7 cm.  No focal abnormality visualized.

Right ovary

Measurements: 4.1 x 2.8 x 3.3 cm. Normal appearance/no adnexal mass.

Left ovary

Measurements: 2.8 x 2.0 x 2.5 cm. Normal appearance/no adnexal mass.

Other findings

Trace free fluid is noted within the pelvic cul-de-sac.
IMPRESSION: Unremarkable pelvic ultrasound.  No evidence for ovarian torsion.

## 2018-01-05 ENCOUNTER — Ambulatory Visit (INDEPENDENT_AMBULATORY_CARE_PROVIDER_SITE_OTHER): Payer: 59 | Admitting: Obstetrics and Gynecology

## 2018-01-05 ENCOUNTER — Encounter: Payer: Self-pay | Admitting: Obstetrics and Gynecology

## 2018-01-05 VITALS — BP 95/61 | HR 97 | Wt 205.0 lb

## 2018-01-05 DIAGNOSIS — O34219 Maternal care for unspecified type scar from previous cesarean delivery: Secondary | ICD-10-CM

## 2018-01-05 DIAGNOSIS — Z3483 Encounter for supervision of other normal pregnancy, third trimester: Secondary | ICD-10-CM

## 2018-01-05 DIAGNOSIS — Z98891 History of uterine scar from previous surgery: Secondary | ICD-10-CM

## 2018-01-05 LAB — POCT URINALYSIS DIPSTICK OB
Bilirubin, UA: NEGATIVE
Blood, UA: NEGATIVE
Glucose, UA: NEGATIVE
Ketones, UA: NEGATIVE
Nitrite, UA: NEGATIVE
POC,PROTEIN,UA: NEGATIVE
Spec Grav, UA: 1.01 (ref 1.010–1.025)
Urobilinogen, UA: 0.2 E.U./dL
pH, UA: 6.5 (ref 5.0–8.0)

## 2018-01-05 NOTE — Progress Notes (Signed)
Pt presents today for ROB. Pt states she is feeling well and has no concerns.

## 2018-01-05 NOTE — Progress Notes (Signed)
ROB: Patient without major complaint.  Says she is just "tired".  Has reaffirmed her desire for tubal ligation at the time of her repeat cesarean..  Reports active fetal movement daily.  Cultures next visit.

## 2018-01-18 ENCOUNTER — Ambulatory Visit (INDEPENDENT_AMBULATORY_CARE_PROVIDER_SITE_OTHER): Payer: 59 | Admitting: Obstetrics and Gynecology

## 2018-01-18 VITALS — BP 100/68 | HR 103 | Wt 212.0 lb

## 2018-01-18 DIAGNOSIS — O34219 Maternal care for unspecified type scar from previous cesarean delivery: Secondary | ICD-10-CM

## 2018-01-18 DIAGNOSIS — Z3483 Encounter for supervision of other normal pregnancy, third trimester: Secondary | ICD-10-CM

## 2018-01-18 LAB — POCT URINALYSIS DIPSTICK OB
Bilirubin, UA: NEGATIVE
GLUCOSE, UA: NEGATIVE
KETONES UA: NEGATIVE
NITRITE UA: NEGATIVE
POC,PROTEIN,UA: NEGATIVE
RBC UA: NEGATIVE
Urobilinogen, UA: 0.2 E.U./dL
pH, UA: 7.5 (ref 5.0–8.0)

## 2018-01-18 NOTE — Progress Notes (Signed)
ROB-Pt stated that she is doing well. Pt think she may have arthritis and want to be tested or referred. GC/CH and Strep B completed today.

## 2018-01-18 NOTE — Progress Notes (Signed)
ROB: Patient thinks she has arthritis and desires to be tested.  Notes that she has had symptoms since her early 4720s, has pain right upper extremity, with numbness in right fingers, and then travels up arm sometimes associated with pain.  Notes it is worse in the colder months with fingers going numb. Discussed that it may be carpal tunnel vs arthritis, advised on waiting until postpartum for referral. 36 week cultures done today. RTC in 1week.

## 2018-01-20 LAB — STREP GP B NAA: Strep Gp B NAA: NEGATIVE

## 2018-01-21 LAB — GC/CHLAMYDIA PROBE AMP
Chlamydia trachomatis, NAA: NEGATIVE
Neisseria gonorrhoeae by PCR: NEGATIVE

## 2018-01-25 ENCOUNTER — Encounter: Payer: Self-pay | Admitting: Obstetrics and Gynecology

## 2018-01-25 ENCOUNTER — Ambulatory Visit (INDEPENDENT_AMBULATORY_CARE_PROVIDER_SITE_OTHER): Payer: 59 | Admitting: Obstetrics and Gynecology

## 2018-01-25 VITALS — BP 109/73 | HR 101 | Wt 211.0 lb

## 2018-01-25 DIAGNOSIS — Z3483 Encounter for supervision of other normal pregnancy, third trimester: Secondary | ICD-10-CM

## 2018-01-25 LAB — POCT URINALYSIS DIPSTICK OB
Bilirubin, UA: NEGATIVE
Glucose, UA: NEGATIVE
KETONES UA: NEGATIVE
NITRITE UA: NEGATIVE
PH UA: 6 (ref 5.0–8.0)
PROTEIN: NEGATIVE
RBC UA: NEGATIVE
Spec Grav, UA: 1.01 (ref 1.010–1.025)
UROBILINOGEN UA: 0.2 U/dL

## 2018-01-25 NOTE — Progress Notes (Signed)
ROB, c/o intermittent lower pelvic pressure.  

## 2018-01-25 NOTE — Progress Notes (Signed)
ROB: Repeat cesarean delivery scheduled for 12/20.  Patient has no complaints.  Signs and symptoms of labor discussed.

## 2018-02-01 ENCOUNTER — Ambulatory Visit (INDEPENDENT_AMBULATORY_CARE_PROVIDER_SITE_OTHER): Payer: 59 | Admitting: Obstetrics and Gynecology

## 2018-02-01 VITALS — BP 116/78 | HR 98 | Wt 213.8 lb

## 2018-02-01 DIAGNOSIS — O34219 Maternal care for unspecified type scar from previous cesarean delivery: Secondary | ICD-10-CM

## 2018-02-01 DIAGNOSIS — Z3483 Encounter for supervision of other normal pregnancy, third trimester: Secondary | ICD-10-CM

## 2018-02-01 DIAGNOSIS — Z98891 History of uterine scar from previous surgery: Secondary | ICD-10-CM

## 2018-02-01 LAB — POCT URINALYSIS DIPSTICK OB
BILIRUBIN UA: NEGATIVE
GLUCOSE, UA: NEGATIVE
KETONES UA: NEGATIVE
LEUKOCYTES UA: NEGATIVE
Nitrite, UA: NEGATIVE
PROTEIN: NEGATIVE
RBC UA: NEGATIVE
SPEC GRAV UA: 1.015 (ref 1.010–1.025)
UROBILINOGEN UA: 0.2 U/dL
pH, UA: 6 (ref 5.0–8.0)

## 2018-02-01 NOTE — Progress Notes (Signed)
ROB: Patient notes general malaise of pregnancy but otherwise ok. Just ready to "be done".  Scheduled for C-section next Friday.

## 2018-02-01 NOTE — Progress Notes (Signed)
ROB-Pt stated that she is doing well no complaints.  

## 2018-02-08 ENCOUNTER — Ambulatory Visit (INDEPENDENT_AMBULATORY_CARE_PROVIDER_SITE_OTHER): Payer: 59 | Admitting: Obstetrics and Gynecology

## 2018-02-08 ENCOUNTER — Encounter: Payer: Self-pay | Admitting: Obstetrics and Gynecology

## 2018-02-08 ENCOUNTER — Other Ambulatory Visit: Payer: Self-pay

## 2018-02-08 ENCOUNTER — Encounter
Admission: RE | Admit: 2018-02-08 | Discharge: 2018-02-08 | Disposition: A | Discharge status: Home / Self Care | Payer: Commercial Managed Care - HMO | Source: Ambulatory Visit | Attending: Obstetrics and Gynecology | Admitting: Obstetrics and Gynecology

## 2018-02-08 ENCOUNTER — Encounter: Payer: Self-pay | Admitting: Surgical

## 2018-02-08 VITALS — BP 103/68 | HR 108 | Wt 215.4 lb

## 2018-02-08 DIAGNOSIS — Z01812 Encounter for preprocedural laboratory examination: Secondary | ICD-10-CM | POA: Insufficient documentation

## 2018-02-08 DIAGNOSIS — O34211 Maternal care for low transverse scar from previous cesarean delivery: Principal | ICD-10-CM | POA: Diagnosis present

## 2018-02-08 DIAGNOSIS — Z3A39 39 weeks gestation of pregnancy: Secondary | ICD-10-CM

## 2018-02-08 DIAGNOSIS — Z3483 Encounter for supervision of other normal pregnancy, third trimester: Secondary | ICD-10-CM

## 2018-02-08 DIAGNOSIS — Z87891 Personal history of nicotine dependence: Secondary | ICD-10-CM

## 2018-02-08 DIAGNOSIS — Z302 Encounter for sterilization: Secondary | ICD-10-CM

## 2018-02-08 LAB — CBC
HCT: 29.1 % — ABNORMAL LOW (ref 36.0–46.0)
Hemoglobin: 9.8 g/dL — ABNORMAL LOW (ref 12.0–15.0)
MCH: 29.6 pg (ref 26.0–34.0)
MCHC: 33.7 g/dL (ref 30.0–36.0)
MCV: 87.9 fL (ref 80.0–100.0)
Platelets: 253 10*3/uL (ref 150–400)
RBC: 3.31 MIL/uL — ABNORMAL LOW (ref 3.87–5.11)
RDW: 13.2 % (ref 11.5–15.5)
WBC: 10 10*3/uL (ref 4.0–10.5)
nRBC: 0.2 % (ref 0.0–0.2)

## 2018-02-08 LAB — POCT URINALYSIS DIPSTICK OB
BILIRUBIN UA: NEGATIVE
Blood, UA: NEGATIVE
GLUCOSE, UA: NEGATIVE
Ketones, UA: NEGATIVE
Nitrite, UA: NEGATIVE
POC,PROTEIN,UA: NEGATIVE
Spec Grav, UA: 1.02 (ref 1.010–1.025)
Urobilinogen, UA: 0.2 E.U./dL
pH, UA: 6.5 (ref 5.0–8.0)

## 2018-02-08 LAB — TYPE AND SCREEN
ABO/RH(D): A POS
Antibody Screen: NEGATIVE
Extend sample reason: UNDETERMINED

## 2018-02-08 NOTE — Pre-Procedure Instructions (Signed)
Jamie at Dr Logan BoresEvans office notified of abnormal CBC results.

## 2018-02-08 NOTE — Patient Instructions (Signed)
Your procedure is scheduled on: Friday February 10, 2018 Report to EMERGENCY DEPARTMENT AT 11:00 AM    REMEMBER: Instructions that are not followed completely may result in serious medical risk, up to and including death; or upon the discretion of your surgeon and anesthesiologist your surgery may need to be rescheduled.  Do not eat food after midnight the night before surgery.  No gum chewing, lozengers or hard candies.  You may however, drink CLEAR liquids up to 2 hours before you are scheduled to arrive for your surgery. Do not drink anything within 2 hours of the start of your surgery.  Clear liquids include: - water  - apple juice without pulp - CLEAR gatorade - black coffee or tea (Do NOT add milk or creamers to the coffee or tea) Do NOT drink anything that is not on this list.  Type 1 and Type 2 diabetics should only drink water.  No Alcohol for 24 hours before or after surgery.  No Smoking including e-cigarettes for 24 hours prior to surgery.  No chewable tobacco products for at least 6 hours prior to surgery.  No nicotine patches on the day of surgery.  On the morning of surgery brush your teeth with toothpaste and water, you may rinse your mouth with mouthwash if you wish. Do not swallow any toothpaste or mouthwash.  Notify your doctor if there is any change in your medical condition (cold, fever, infection).  Do not wear jewelry, make-up, hairpins, clips or nail polish.  Do not wear lotions, powders, or perfumes.   Do not shave 48 hours prior to surgery.   Contacts and dentures may not be worn into surgery.  Do not bring valuables to the hospital, including drivers license, insurance or credit cards.  [facility] is not responsible for any belongings or valuables.   TAKE THESE MEDICATIONS THE MORNING OF SURGERY: NO MEDICATION  Use CHG  wipes as directed on instruction sheet.  Follow recommendations from Cardiologist, Pulmonologist or PCP regarding stopping  Aspirin, Coumadin, Plavix, Eliquis, Pradaxa, or Pletal.  Stop Anti-inflammatories (NSAIDS) such as Advil, Aleve, Ibuprofen, Motrin, Naproxen, Naprosyn and Aspirin based products such as Excedrin, Goodys Powder, BC Powder. (May take Tylenol or Acetaminophen if needed.)  Stop ANY OVER THE COUNTER supplements until after surgery. (May continue Vitamin D, Vitamin B, and multivitamin.)  Wear comfortable clothing (specific to your surgery type) to the hospital.  Plan for stool softeners for home use.  If you are being admitted to the hospital overnight, leave your suitcase in the car. After surgery it may be brought to your room.  If you are being discharged the day of surgery, you will not be allowed to drive home. You will need a responsible adult to drive you home and stay with you that night.   If you are taking public transportation, you will need to have a responsible adult with you. Please confirm with your physician that it is acceptable to use public transportation.   Please call [phone] if you have any questions about these instructions.

## 2018-02-08 NOTE — Progress Notes (Signed)
ROB, no complaints.  

## 2018-02-08 NOTE — Progress Notes (Signed)
ROB:  CD on Friday - discussed and all questions answered.  Reaffirmed her desire for tubal ligation.

## 2018-02-10 ENCOUNTER — Inpatient Hospital Stay: Admission: RE | Admit: 2018-02-10 | Payer: 59 | Source: Home / Self Care | Admitting: Obstetrics and Gynecology

## 2018-02-10 ENCOUNTER — Other Ambulatory Visit: Payer: Self-pay

## 2018-02-10 ENCOUNTER — Inpatient Hospital Stay: Payer: Commercial Managed Care - HMO | Admitting: Anesthesiology

## 2018-02-10 ENCOUNTER — Encounter
Admission: EM | Disposition: A | Discharge status: Home / Self Care | Payer: Self-pay | Source: Home / Self Care | Attending: Obstetrics and Gynecology

## 2018-02-10 ENCOUNTER — Inpatient Hospital Stay
Admission: EM | Admit: 2018-02-10 | Discharge: 2018-02-12 | DRG: 785 | Disposition: A | Discharge status: Home / Self Care | Payer: Commercial Managed Care - HMO | Attending: Obstetrics and Gynecology | Admitting: Obstetrics and Gynecology

## 2018-02-10 DIAGNOSIS — Z3490 Encounter for supervision of normal pregnancy, unspecified, unspecified trimester: Secondary | ICD-10-CM

## 2018-02-10 DIAGNOSIS — Z3A39 39 weeks gestation of pregnancy: Secondary | ICD-10-CM | POA: Diagnosis not present

## 2018-02-10 DIAGNOSIS — O34211 Maternal care for low transverse scar from previous cesarean delivery: Secondary | ICD-10-CM | POA: Diagnosis present

## 2018-02-10 DIAGNOSIS — Z87891 Personal history of nicotine dependence: Secondary | ICD-10-CM | POA: Diagnosis not present

## 2018-02-10 DIAGNOSIS — O26893 Other specified pregnancy related conditions, third trimester: Secondary | ICD-10-CM | POA: Diagnosis present

## 2018-02-10 DIAGNOSIS — Z302 Encounter for sterilization: Secondary | ICD-10-CM | POA: Diagnosis not present

## 2018-02-10 LAB — URINE DRUG SCREEN, QUALITATIVE (ARMC ONLY)
AMPHETAMINES, UR SCREEN: NOT DETECTED
Barbiturates, Ur Screen: NOT DETECTED
Benzodiazepine, Ur Scrn: NOT DETECTED
Cannabinoid 50 Ng, Ur ~~LOC~~: NOT DETECTED
Cocaine Metabolite,Ur ~~LOC~~: NOT DETECTED
MDMA (Ecstasy)Ur Screen: NOT DETECTED
Methadone Scn, Ur: NOT DETECTED
Opiate, Ur Screen: NOT DETECTED
Phencyclidine (PCP) Ur S: NOT DETECTED
Tricyclic, Ur Screen: NOT DETECTED

## 2018-02-10 LAB — ABO/RH: ABO/RH(D): A POS

## 2018-02-10 SURGERY — Surgical Case
Anesthesia: Spinal | Laterality: Bilateral

## 2018-02-10 MED ORDER — LIDOCAINE 5 % EX PTCH
MEDICATED_PATCH | CUTANEOUS | Status: DC | PRN
  Administered 2018-02-10: 1 via TRANSDERMAL

## 2018-02-10 MED ORDER — ONDANSETRON HCL 4 MG/2ML IJ SOLN
INTRAMUSCULAR | Status: DC | PRN
  Administered 2018-02-10: 4 mg via INTRAVENOUS

## 2018-02-10 MED ORDER — ONDANSETRON HCL 4 MG/2ML IJ SOLN
INTRAMUSCULAR | Status: AC
  Administered 2018-02-10: 4 mg via INTRAVENOUS
  Filled 2018-02-10: qty 2

## 2018-02-10 MED ORDER — OXYTOCIN 40 UNITS IN LACTATED RINGERS INFUSION - SIMPLE MED
INTRAVENOUS | Status: AC
  Filled 2018-02-10: qty 1000

## 2018-02-10 MED ORDER — SOD CITRATE-CITRIC ACID 500-334 MG/5ML PO SOLN
ORAL | Status: AC
  Administered 2018-02-10: 30 mL
  Filled 2018-02-10: qty 15

## 2018-02-10 MED ORDER — OXYCODONE HCL 5 MG PO TABS
10.0000 mg | ORAL_TABLET | ORAL | Status: DC | PRN
  Administered 2018-02-12 (×2): 10 mg via ORAL
  Filled 2018-02-10 (×3): qty 2

## 2018-02-10 MED ORDER — NALBUPHINE HCL 10 MG/ML IJ SOLN: 5.0000 mg | INTRAMUSCULAR | Status: DC | PRN

## 2018-02-10 MED ORDER — DIPHENHYDRAMINE HCL 50 MG/ML IJ SOLN: 12.5000 mg | INTRAMUSCULAR | Status: DC | PRN

## 2018-02-10 MED ORDER — NALBUPHINE HCL 10 MG/ML IJ SOLN: 5.0000 mg | Freq: Once | INTRAMUSCULAR | Status: DC | PRN

## 2018-02-10 MED ORDER — OXYCODONE HCL 5 MG PO TABS
5.0000 mg | ORAL_TABLET | ORAL | Status: DC | PRN
  Administered 2018-02-11 – 2018-02-12 (×3): 5 mg via ORAL
  Filled 2018-02-10 (×2): qty 1

## 2018-02-10 MED ORDER — KETOROLAC TROMETHAMINE 30 MG/ML IJ SOLN
30.0000 mg | Freq: Four times a day (QID) | INTRAMUSCULAR | Status: AC
  Administered 2018-02-10 – 2018-02-11 (×3): 30 mg via INTRAVENOUS
  Filled 2018-02-10 (×4): qty 1

## 2018-02-10 MED ORDER — LACTATED RINGERS IV SOLN
INTRAVENOUS | Status: DC
  Administered 2018-02-10: 13:00:00 via INTRAVENOUS

## 2018-02-10 MED ORDER — LACTATED RINGERS IV BOLUS
1000.0000 mL | Freq: Once | INTRAVENOUS | Status: AC
  Administered 2018-02-10: 1000 mL via INTRAVENOUS

## 2018-02-10 MED ORDER — OXYTOCIN 40 UNITS IN LACTATED RINGERS INFUSION - SIMPLE MED
INTRAVENOUS | Status: DC | PRN
  Administered 2018-02-10: 400 mL via INTRAVENOUS
  Administered 2018-02-10: 40 mL via INTRAVENOUS

## 2018-02-10 MED ORDER — LIDOCAINE 5 % EX PTCH
MEDICATED_PATCH | CUTANEOUS | Status: AC
  Filled 2018-02-10: qty 1

## 2018-02-10 MED ORDER — MORPHINE SULFATE (PF) 0.5 MG/ML IJ SOLN
INTRAMUSCULAR | Status: AC
  Filled 2018-02-10: qty 10

## 2018-02-10 MED ORDER — CEFAZOLIN SODIUM-DEXTROSE 2-4 GM/100ML-% IV SOLN
2.0000 g | Freq: Once | INTRAVENOUS | Status: AC
  Administered 2018-02-10: 2 g via INTRAVENOUS

## 2018-02-10 MED ORDER — PHENYLEPHRINE HCL 10 MG/ML IJ SOLN
INTRAMUSCULAR | Status: DC | PRN
  Administered 2018-02-10: 100 ug via INTRAVENOUS

## 2018-02-10 MED ORDER — FENTANYL CITRATE (PF) 100 MCG/2ML IJ SOLN
INTRAMUSCULAR | Status: AC
  Filled 2018-02-10: qty 2

## 2018-02-10 MED ORDER — FENTANYL CITRATE (PF) 100 MCG/2ML IJ SOLN
INTRAMUSCULAR | Status: DC | PRN
  Administered 2018-02-10: 15 ug via INTRATHECAL

## 2018-02-10 MED ORDER — SODIUM CHLORIDE 0.9 % IV SOLN
INTRAVENOUS | Status: DC | PRN
  Administered 2018-02-10: 50 ug/min via INTRAVENOUS

## 2018-02-10 MED ORDER — DIPHENHYDRAMINE HCL 25 MG PO CAPS: 25.0000 mg | ORAL_CAPSULE | ORAL | Status: DC | PRN

## 2018-02-10 MED ORDER — MORPHINE SULFATE (PF) 0.5 MG/ML IJ SOLN
INTRAMUSCULAR | Status: DC | PRN
  Administered 2018-02-10: .1 mg via INTRATHECAL

## 2018-02-10 MED ORDER — CEFAZOLIN SODIUM-DEXTROSE 2-4 GM/100ML-% IV SOLN
2.0000 g | INTRAVENOUS | Status: DC
  Filled 2018-02-10: qty 100

## 2018-02-10 MED ORDER — NALOXONE HCL 0.4 MG/ML IJ SOLN: 0.4000 mg | INTRAMUSCULAR | Status: DC | PRN

## 2018-02-10 MED ORDER — KETOROLAC TROMETHAMINE 30 MG/ML IJ SOLN
30.0000 mg | Freq: Four times a day (QID) | INTRAMUSCULAR | Status: AC
  Administered 2018-02-11: 30 mg via INTRAMUSCULAR

## 2018-02-10 MED ORDER — ONDANSETRON HCL 4 MG/2ML IJ SOLN
4.0000 mg | Freq: Three times a day (TID) | INTRAMUSCULAR | Status: DC | PRN
  Administered 2018-02-10: 4 mg via INTRAVENOUS

## 2018-02-10 MED ORDER — MEPERIDINE HCL 25 MG/ML IJ SOLN: 6.2500 mg | INTRAMUSCULAR | Status: DC | PRN

## 2018-02-10 MED ORDER — ACETAMINOPHEN 325 MG PO TABS
650.0000 mg | ORAL_TABLET | Freq: Four times a day (QID) | ORAL | Status: AC
  Administered 2018-02-11 (×3): 650 mg via ORAL
  Filled 2018-02-10 (×3): qty 2

## 2018-02-10 MED ORDER — SODIUM CHLORIDE 0.9% FLUSH: 3.0000 mL | INTRAVENOUS | Status: DC | PRN

## 2018-02-10 SURGICAL SUPPLY — 26 items
ADHESIVE MASTISOL STRL (MISCELLANEOUS) ×3 IMPLANT
BAG COUNTER SPONGE EZ (MISCELLANEOUS) ×2 IMPLANT
CANISTER SUCT 3000ML PPV (MISCELLANEOUS) ×3 IMPLANT
CHLORAPREP W/TINT 26ML (MISCELLANEOUS) ×6 IMPLANT
COUNTER SPONGE BAG EZ (MISCELLANEOUS) ×1
COVER WAND RF STERILE (DRAPES) ×3 IMPLANT
DRSG TELFA 3X8 NADH (GAUZE/BANDAGES/DRESSINGS) ×3 IMPLANT
GAUZE SPONGE 4X4 12PLY STRL (GAUZE/BANDAGES/DRESSINGS) ×3 IMPLANT
GLOVE BIOGEL PI ORTHO PRO 7.5 (GLOVE) ×2
GLOVE PI ORTHO PRO STRL 7.5 (GLOVE) ×1 IMPLANT
GOWN STRL REUS W/ TWL LRG LVL3 (GOWN DISPOSABLE) ×2 IMPLANT
GOWN STRL REUS W/TWL LRG LVL3 (GOWN DISPOSABLE) ×4
KIT TURNOVER KIT A (KITS) ×3 IMPLANT
NS IRRIG 1000ML POUR BTL (IV SOLUTION) ×3 IMPLANT
PACK C SECTION AR (MISCELLANEOUS) ×3 IMPLANT
PAD DRESSING TELFA 3X8 NADH (GAUZE/BANDAGES/DRESSINGS) ×1 IMPLANT
PAD OB MATERNITY 4.3X12.25 (PERSONAL CARE ITEMS) ×3 IMPLANT
PAD PREP 24X41 OB/GYN DISP (PERSONAL CARE ITEMS) ×3 IMPLANT
RETRACTOR WND ALEXIS-O 25 LRG (MISCELLANEOUS) ×1 IMPLANT
RTRCTR WOUND ALEXIS O 25CM LRG (MISCELLANEOUS) ×3
SPONGE LAP 18X18 RF (DISPOSABLE) ×3 IMPLANT
SUT VIC AB 0 CTX 36 (SUTURE) ×4
SUT VIC AB 0 CTX36XBRD ANBCTRL (SUTURE) ×2 IMPLANT
SUT VIC AB 1 CT1 36 (SUTURE) ×6 IMPLANT
SUT VICRYL 3-0 36IN CTB-1 (SUTURE) ×2 IMPLANT
SUT VICRYL+ 3-0 36IN CT-1 (SUTURE) ×6 IMPLANT

## 2018-02-10 NOTE — Op Note (Signed)
      OP NOTE  Date: 02/10/2018   5:51 PM Name Leslie HeapSarah Elizabeth Lye MR# [id number]  Preoperative Diagnosis: 1. Intrauterine pregnancy at 1772w3d 2. Desires Permanent Sterilization 3. Desires repeat CD Active Problems:   Term pregnancy  Postoperative Diagnosis: 1. Intrauterine pregnancy at 5172w3d, delivered 2. Desires Permanent Sterilization 3. Viable infant 4. Remainder same as pre-op  Procedure: 1. Repeat Low-Transverse Cesarean Section 2. Bilateral Tubal Occlusion  Surgeon: Elonda Huskyavid J. Monterrio Gerst, MD  Assistant:    Anesthesia: Spinal   EBL: 640  ml    Findings: 1) Viable infant, Apgar scores of 8    at 1 minute and 9    at 5 minutes and a birthweight of 134.39  ounces.    2) Normal uterus, tubes and ovaries.   Procedure:   The patient was prepped and draped in the supine position and placed under spinal anesthesia.  A transverse incision was made across the abdomen in a Pfannenstiel manner. If indicated the old scar was systematically removed with sharp dissection.  We carried the dissection down to the level of the fascia.  The fascia was incised in a curvilinear manner.  The fascia was then elevated from the rectus muscles with blunt and sharp dissection.  The rectus muscles were separated laterally exposing the peritoneum.  The peritoneum was carefully entered with care being taken to avoid bowel and bladder.  A self-retaining retractor was placed.  The visceral peritoneum was incised in a curvilinear fashion across the lower uterine segment creating a bladder flap. A transverse incision was made across the lower uterine segment and extended laterally and superiorly using the bandage scissors.  Artificial rupture membranes was performed and Clear fluid was noted.  The infant was delivered from the cephalic position.  A nuchal cord was not present. The cord was doubly clamped and cut. Cord blood was obtained if appropriate.  The infant was handed to the pediatric personnel  who then  placed the infant under heat lamps where it was cleaned dried and re-suctioned. The placenta was delivered. The hysterotomy incision was then identified on ring forceps.  The uterine cavity was cleaned with a moist lap sponge.  The hysterotomy incision was closed with a running interlocking suture of Vicryl.  Hemostasis was excellent.  Pitocin was run in the IV and the uterus was found to be firm. The fallopian tubes were identified  and followed out to their fine fimbriated ends and then back to the mid-portion of the tube which was elevated on a babcock clamp.  Both tubes were completely occluded using Filshie clips in a perpendicular manner.  Hemostasis was noted. The posterior cul-de-sac and gutters were cleaned and inspected.  Hemostasis was noted.  The fascia was then closed with a running suture of #1 Vicryl.  Hemostasis of the subcutaneous tissues was obtained using the Bovie.  The subcutaneous tissues were closed with a running suture of 000 Vicryl.  A subcuticular suture was placed.  Steri-Strips were applied in the usual manner.  A pressure dressing was placed.  The patient went to the recovery room in stable condition.   Elonda Huskyavid J. Alanee Ting, M.D. 02/10/2018 5:51 PM

## 2018-02-10 NOTE — Anesthesia Post-op Follow-up Note (Signed)
Anesthesia QCDR form completed.        

## 2018-02-10 NOTE — Anesthesia Preprocedure Evaluation (Signed)
Anesthesia Evaluation  Patient identified by MRN, date of birth, ID band Patient awake    Reviewed: Allergy & Precautions, NPO status , Patient's Chart, lab work & pertinent test results  History of Anesthesia Complications Negative for: history of anesthetic complications  Airway Mallampati: III  TM Distance: >3 FB Neck ROM: Full    Dental no notable dental hx.    Pulmonary neg sleep apnea, neg COPD, former smoker,    breath sounds clear to auscultation- rhonchi (-) wheezing      Cardiovascular Exercise Tolerance: Good (-) hypertension(-) CAD, (-) Past MI, (-) Cardiac Stents and (-) CABG  Rhythm:Regular Rate:Normal - Systolic murmurs and - Diastolic murmurs    Neuro/Psych  Headaches, negative psych ROS   GI/Hepatic negative GI ROS, Neg liver ROS,   Endo/Other  negative endocrine ROSneg diabetes  Renal/GU negative Renal ROS     Musculoskeletal negative musculoskeletal ROS (+)   Abdominal (+) + obese,   Peds  Hematology negative hematology ROS (+)   Anesthesia Other Findings Past Medical History: No date: Dyspareunia in female No date: Headache No date: Heavy periods No date: Increased BMI No date: Menometrorrhagia No date: Ovarian cyst No date: PID (acute pelvic inflammatory disease) No date: 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) No date: Seasonal allergies   Reproductive/Obstetrics (+) Pregnancy                             Lab Results  Component Value Date   WBC 10.0 02/08/2018   HGB 9.8 (L) 02/08/2018   HCT 29.1 (L) 02/08/2018   MCV 87.9 02/08/2018   PLT 253 02/08/2018    Anesthesia Physical Anesthesia Plan  ASA: II  Anesthesia Plan: Spinal   Post-op Pain Management:    Induction:   PONV Risk Score and Plan: 2 and Ondansetron  Airway Management Planned: Natural Airway  Additional Equipment:   Intra-op Plan:   Post-operative Plan:   Informed Consent:  I have reviewed the patients History and Physical, chart, labs and discussed the procedure including the risks, benefits and alternatives for the proposed anesthesia with the patient or authorized representative who has indicated his/her understanding and acceptance.   Dental advisory given  Plan Discussed with: CRNA and Anesthesiologist  Anesthesia Plan Comments:         Anesthesia Quick Evaluation

## 2018-02-10 NOTE — Interval H&P Note (Signed)
History and Physical Interval Note:  02/10/2018 1:29 PM  Leslie Walters  has presented today for surgery, with the diagnosis of PRIOR C SECTION DESIRES STERILIZATION  The various methods of treatment have been discussed with the patient and family. After consideration of risks, benefits and other options for treatment, the patient has consented to  Procedure(s): REPEAT CESAREAN SECTION WITH BILATERAL TUBAL LIGATION (Bilateral) as a surgical intervention .  The patient's history has been reviewed, patient examined, no change in status, stable for surgery.  I have reviewed the patient's chart and labs.  Questions were answered to the patient's satisfaction.     Brennan Baileyavid Evans

## 2018-02-10 NOTE — Transfer of Care (Signed)
Immediate Anesthesia Transfer of Care Note  Patient: Leslie Walters  Procedure(s) Performed: REPEAT CESAREAN SECTION WITH BILATERAL TUBAL LIGATION (Bilateral )  Patient Location: PACU and Mother/Baby  Anesthesia Type:Regional and Spinal  Level of Consciousness: awake and alert   Airway & Oxygen Therapy: Patient Spontanous Breathing  Post-op Assessment: Report given to RN and Post -op Vital signs reviewed and stable  Post vital signs: Reviewed and stable  Last Vitals:  Vitals Value Taken Time  BP 96/66 02/10/2018  2:47 PM  Temp 36.5 C 02/10/2018  2:47 PM  Pulse 88 02/10/2018  2:47 PM  Resp 15 02/10/2018  2:47 PM  SpO2 100 % 02/10/2018  2:47 PM    Last Pain:  Vitals:   02/10/18 1447  TempSrc: Tympanic  PainSc:          Complications: No apparent anesthesia complications

## 2018-02-10 NOTE — H&P (Signed)
History and Physical   HPI  Leslie HeapSarah Elizabeth Walters is a 32 y.o. [id number] at 6511w3d Estimated Date of Delivery: 02/14/18 who is being admitted for  C-section with tubal sterilization    OB History  OB History  Gravida Para Term Preterm AB Living  4 1 1  0 2 1  SAB TAB Ectopic Multiple Live Births  0 0 0 0 1    # Outcome Date GA Lbr Len/2nd Weight Sex Delivery Anes PTL Lv  4 Current           3 Term 2011   3992 g M CS-LTranv   LIV  2 AB 2007          1 AB 2005            PROBLEM LIST  Pregnancy complications or risks: Patient Active Problem List   Diagnosis Date Noted  . Term pregnancy 02/10/2018  . History of cesarean delivery 10/27/2017  . 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 Dyspareunia in female   . Headache   . Heavy periods   . Increased BMI   . Menometrorrhagia   . Ovarian cyst   . PID (acute pelvic inflammatory disease)   . 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 Seasonal allergies      Past Surgical History:  Procedure Laterality Date  . CESAREAN SECTION    . colposcopy    . WISDOM TOOTH EXTRACTION       Medications    Current Discharge Medication List    CONTINUE these medications which have NOT CHANGED   Details  acetaminophen (TYLENOL) 500 MG tablet Take 1,000 mg by mouth every 6 (six) hours as needed for moderate pain or headache.    Associated Diagnoses: Encounter for supervision of normal first pregnancy in second trimester    Prenatal Vit-Fe Fumarate-FA (MULTIVITAMIN-PRENATAL) 27-0.8 MG TABS tablet Take 1 tablet by mouth daily.    Associated Diagnoses: Encounter  for supervision of normal first pregnancy in second trimester         Allergies  Patient has no known allergies.  Review of Systems  Pertinent items are noted in HPI.  Physical Exam  BP 114/69 (BP Location: Right Arm)   Pulse 93   Temp 98.2 F (36.8 C) (Oral)   Resp 18   Ht 5\' 2"  (1.575 m)   Wt 97.1 kg   LMP 04/21/2017 (Approximate)   BMI 39.14 kg/m   Lungs:  CTA B Cardio: RRR without M/R/G Abd: Soft, gravid, NT Presentation: cephalic EXT: No C/C/ 1+ Edema DTRs: 2+ B CERVIX:     See Prenatal records for more detailed PE.     FHR:  Variability: Good {> 6 bpm)  Toco: Uterine Contractions: None    Test Results  Results for orders placed or performed during the hospital encounter of 02/10/18 (from the past 24 hour(s))  ABO/Rh     Status: None   Collection Time: 02/10/18 10:48 AM  Result Value Ref Range   ABO/RH(D)      A POS Performed at [facility], [address] [place], [place] [address]  Urine Drug Screen, Qualitative ([facility] only)     Status: None   Collection Time: 02/10/18 11:05 AM  Result Value Ref Range   Tricyclic, Ur Screen NONE DETECTED NONE DETECTED   Amphetamines, Ur Screen NONE DETECTED NONE DETECTED   MDMA (Ecstasy)Ur Screen NONE DETECTED NONE DETECTED   Cocaine Metabolite,Ur [place] NONE DETECTED NONE DETECTED   Opiate, Ur Screen NONE DETECTED NONE DETECTED   Phencyclidine (PCP) Ur S NONE DETECTED NONE DETECTED   Cannabinoid 50 Ng, Ur [place] NONE DETECTED NONE DETECTED   Barbiturates, Ur Screen NONE DETECTED NONE DETECTED   Benzodiazepine, Ur Scrn NONE DETECTED NONE DETECTED   Methadone Scn, Ur NONE DETECTED NONE DETECTED     Assessment   G4P1021 at 6553w3d Estimated Date of Delivery: 02/14/18  The fetus is reassuring.    Patient Active Problem List   Diagnosis Date Noted  . Term pregnancy 02/10/2018  . History of cesarean delivery 10/27/2017  . 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 Admit to L&D :   plan  Cesarean delivery 2. EFM: -- Category 1   Elonda Huskyavid J. Evans, M.D. 02/10/2018 1:28 PM

## 2018-02-11 MED ORDER — PRENATAL MULTIVITAMIN CH
1.0000 | ORAL_TABLET | Freq: Every day | ORAL | Status: DC
  Administered 2018-02-11 – 2018-02-12 (×2): 1 via ORAL
  Filled 2018-02-11 (×2): qty 1

## 2018-02-11 MED ORDER — SENNOSIDES-DOCUSATE SODIUM 8.6-50 MG PO TABS
2.0000 | ORAL_TABLET | ORAL | Status: DC
  Administered 2018-02-12: 2 via ORAL
  Filled 2018-02-11: qty 2

## 2018-02-11 MED ORDER — OXYTOCIN 40 UNITS IN LACTATED RINGERS INFUSION - SIMPLE MED
2.5000 [IU]/h | INTRAVENOUS | Status: AC
  Filled 2018-02-11: qty 1000

## 2018-02-11 MED ORDER — ZOLPIDEM TARTRATE 5 MG PO TABS: 5.0000 mg | ORAL_TABLET | Freq: Every evening | ORAL | Status: DC | PRN

## 2018-02-11 MED ORDER — DIPHENHYDRAMINE HCL 25 MG PO CAPS: 25.0000 mg | ORAL_CAPSULE | Freq: Four times a day (QID) | ORAL | Status: DC | PRN

## 2018-02-11 MED ORDER — COCONUT OIL OIL: 1.0000 "application " | TOPICAL_OIL | Status: DC | PRN

## 2018-02-11 MED ORDER — SIMETHICONE 80 MG PO CHEW
80.0000 mg | CHEWABLE_TABLET | Freq: Four times a day (QID) | ORAL | Status: DC
  Administered 2018-02-11 – 2018-02-12 (×5): 80 mg via ORAL
  Filled 2018-02-11 (×5): qty 1

## 2018-02-11 MED ORDER — IBUPROFEN 600 MG PO TABS
600.0000 mg | ORAL_TABLET | Freq: Four times a day (QID) | ORAL | Status: DC
  Administered 2018-02-11 – 2018-02-12 (×4): 600 mg via ORAL
  Filled 2018-02-11 (×4): qty 1

## 2018-02-11 MED ORDER — LACTATED RINGERS IV SOLN: INTRAVENOUS | Status: DC

## 2018-02-11 MED ORDER — MENTHOL 3 MG MT LOZG
1.0000 | LOZENGE | OROMUCOSAL | Status: DC | PRN
  Filled 2018-02-11: qty 9

## 2018-02-11 NOTE — Anesthesia Postprocedure Evaluation (Signed)
Anesthesia Post Note  Patient: Leslie HeapSarah Elizabeth Walters  Procedure(s) Performed: REPEAT CESAREAN SECTION WITH BILATERAL TUBAL LIGATION (Bilateral )  Patient location during evaluation: Mother Baby Anesthesia Type: Spinal Level of consciousness: awake and alert and oriented Pain management: pain level controlled Vital Signs Assessment: post-procedure vital signs reviewed and stable Respiratory status: spontaneous breathing Cardiovascular status: blood pressure returned to baseline Postop Assessment: no headache Anesthetic complications: no     Last Vitals:  Vitals:   02/11/18 0810 02/11/18 1609  BP: 97/61 112/73  Pulse: 78 96  Resp: 18 18  Temp: 36.7 C 36.7 C  SpO2: 98% 98%    Last Pain:  Vitals:   02/11/18 1609  TempSrc: Oral  PainSc:                  [name]

## 2018-02-11 NOTE — Progress Notes (Signed)
Patient ID: Leslie HeapSarah Elizabeth Walters, female   DOB: Dec 29, 1985, 32 y.o.   MRN: [id number]    Progress Note - Cesarean Delivery  Leslie Walters is a 32 y.o. [phone] now PP day 1 s/p C-Section, Low Transverse .   Subjective:  Patient reports no problems with eating, bowel movements, voiding, or their wound  Pain controlled - pt considering discharge today  Objective:  Vital signs in last 24 hours: Temp:  [97.7 F (36.5 C)-98.4 F (36.9 C)] 98 F (36.7 C) (12/21 0810) Pulse Rate:  [76-110] 78 (12/21 0810) Resp:  [12-20] 18 (12/21 0810) BP: (93-119)/(56-76) 97/61 (12/21 0810) SpO2:  [97 %-100 %] 98 % (12/21 0810) Weight:  [97.1 kg] 97.1 kg (12/20 1043)  Physical Exam:  General: alert, cooperative and no distress Lochia: appropriate Uterine Fundus: firm Incision: healing well - dressing intact DVT Evaluation: No evidence of DVT seen on physical exam.    Data Review No results for input(s): HGB, HCT in the last 72 hours.  Assessment:  Active Problems:   Term pregnancy   Status post Cesarean section. Doing well postoperatively.   Considering d/c today  Plan:       Continue current care.    Elonda Huskyavid J. Evans, M.D. 02/11/2018 10:06 AM

## 2018-02-11 NOTE — Anesthesia Post-op Follow-up Note (Signed)
  Anesthesia Pain Follow-up Note  Patient: Leslie HeapSarah Elizabeth Walters  Day #: 1  Date of Follow-up: 02/11/2018 Time: 5:12 PM  Last Vitals:  Vitals:   02/11/18 0810 02/11/18 1609  BP: 97/61 112/73  Pulse: 78 96  Resp: 18 18  Temp: 36.7 C 36.7 C  SpO2: 98% 98%    Level of Consciousness: alert  Pain: mild   Side Effects:None  Catheter Site Exam:clean, dry, no drainage     Plan: D/C from anesthesia care at surgeon's request  Hazem Kenner

## 2018-02-12 MED ORDER — OXYCODONE HCL 5 MG PO TABS: 5.0000 mg | ORAL_TABLET | ORAL | 0 refills | Status: DC | PRN

## 2018-02-12 NOTE — Discharge Summary (Signed)
     Physician Obstetric Discharge Summary  Patient ID: Leslie Walters MRN: [id number] DOB/AGE: [birth year] 32 y.o.   Date of Admission: 02/10/2018  Date of Discharge: 02/12/2018  Admitting Diagnosis: Scheduled cesarean section at 534w3d  - Filshie tubal occlusion   Mode of Delivery: repeat cesarean section       low uterine, transverse     Discharge Diagnosis: No other diagnosis   Intrapartum Procedures:    Post partum procedures:   Complications: none                        Discharge Day SOAP Note:  Subjective:  The patient has no complaints.  She is ambulating well. She is taking PO well. Pain is well controlled with current medications. Patient is urinating without difficulty.   She is passing flatus.    Objective  Vital signs in last 24 hours: BP 114/81 (BP Location: Right Arm)   Pulse 81   Temp 97.8 F (36.6 C) (Oral)   Resp 18   Ht 5\' 2"  (1.575 m)   Wt 97.1 kg   LMP 04/21/2017 (Approximate)   SpO2 99%   Breastfeeding Unknown   BMI 39.14 kg/m   Physical Exam: Gen: NAD Abdomen:  clean, dry, no drainage Fundus Fundal Tone: Firm  Lochia Amount: Small     Data Review Labs: CBC Latest Ref Rng & Units 02/08/2018 11/24/2017 07/29/2017  WBC 4.0 - 10.5 K/uL 10.0 12.8(H) 10.6  Hemoglobin 12.0 - 15.0 g/dL 4.0(J9.8(L) 81.111.1 91.413.0  Hematocrit 36.0 - 46.0 % 29.1(L) 32.3(L) 37.5  Platelets 150 - 400 K/uL 253 243 271   A POS Performed at [facility], [address] [place], [place] [address]   Assessment:  Active Problems:   Term pregnancy   Doing well.  Normal progress as expected.    Plan:  Discharge to home  Modified rest as directed - may slowly resume normal activities with restrictions  as discussed.  Medications as written.  See below for additional.       Discharge Instructions: Per After Visit Summary. Activity: Advance as tolerated. Pelvic rest for 6 weeks.  Also refer to After Visit Summary.  Wound care discussed. Diet:  Regular Medications: Allergies as of 02/12/2018   No Known Allergies     Medication List    STOP taking these medications   acetaminophen 500 MG tablet Commonly known as:  TYLENOL     TAKE these medications   multivitamin-prenatal 27-0.8 MG Tabs tablet Take 1 tablet by mouth daily.   oxyCODONE 5 MG immediate release tablet Commonly known as:  Oxy IR/ROXICODONE Take 1 tablet (5 mg total) by mouth every 4 (four) hours as needed for moderate pain.      Outpatient follow up:  Follow-up Information    Linzie CollinEvans, Nephi Savage James, MD Follow up in 1 week(s).   Specialty:  Obstetrics and Gynecology Contact information: [address] [place] [address] [phone]          Postpartum contraception: Will discuss at first post-partum visit.  Discharged Condition: good  Discharged to: home  Newborn Data: Disposition:home with mother  Apgars: APGAR (1 MIN): 8   APGAR (5 MINS): 9   APGAR (10 MINS):    Baby Feeding: Breast  Elonda Huskyavid J. Welden Hausmann, M.D. 02/12/2018 9:35 AM

## 2018-02-12 NOTE — Progress Notes (Signed)
Discharge instructions given. Patient verbalizes understanding of teaching. Patient discharged home via wheelchair at 1415.

## 2018-02-13 ENCOUNTER — Encounter: Payer: Self-pay | Admitting: Obstetrics and Gynecology

## 2018-02-13 LAB — SAMPLE TO BLOOD BANK

## 2018-02-20 ENCOUNTER — Encounter: Payer: Self-pay | Admitting: Obstetrics and Gynecology

## 2018-02-20 ENCOUNTER — Ambulatory Visit (INDEPENDENT_AMBULATORY_CARE_PROVIDER_SITE_OTHER): Payer: 59 | Admitting: Obstetrics and Gynecology

## 2018-02-20 VITALS — BP 118/79 | HR 88 | Ht 62.0 in | Wt 191.1 lb

## 2018-02-20 DIAGNOSIS — Z9889 Other specified postprocedural states: Secondary | ICD-10-CM

## 2018-02-20 NOTE — Progress Notes (Signed)
Patient comes in today for an incision check. Patient states that the site looks good. No problems or concerns today.

## 2018-02-20 NOTE — Progress Notes (Signed)
HPI:      Ms. Tacey HeapSarah Elizabeth Mandigo is a 32 y.o. [id number] who LMP was No LMP recorded.  Subjective:   She presents today 1 week from cesarean delivery.  She reports that she is doing well.  She is no longer taking narcotics and reports that Motrin solves all of her pain issues.  She reports eating voiding and bowel movements without difficulty.  She continues to breast-feed sporadically but is supplementing with bottle.  She plans to call the lactation consultant later today for some help with this.    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 She does not have any pertinent problems on file. She  has a past surgical history that includes colposcopy; Cesarean section; Wisdom tooth extraction; and Cesarean section with bilateral tubal ligation (Bilateral, 02/10/2018). Her family history includes Breast cancer in her maternal aunt; Diabetes in her maternal grandfather; Heart disease in her mother; Ovarian cancer in her maternal aunt. She  reports that she quit smoking about 9 months ago. Her smoking use included cigarettes. She has never used smokeless tobacco. She reports current alcohol use. She reports current drug use. Drug: Marijuana. She has a current medication list which includes the following prescription(s): multivitamin-prenatal. She has No Known Allergies.       Review of Systems:  Review of Systems  Constitutional: Denied constitutional symptoms, night sweats, recent illness, fatigue, fever, insomnia and weight loss.  Eyes: Denied eye symptoms, eye pain, photophobia, vision change and visual disturbance.  Ears/Nose/Throat/Neck: Denied ear, nose, throat or neck symptoms, hearing loss,  nasal discharge, sinus congestion and sore throat.  Cardiovascular: Denied cardiovascular symptoms, arrhythmia, chest pain/pressure, edema, exercise intolerance, orthopnea and palpitations.  Respiratory: Denied pulmonary symptoms, asthma, pleuritic pain, productive sputum, cough, dyspnea and wheezing.  Gastrointestinal: Denied, gastro-esophageal reflux, melena, nausea and vomiting.  Genitourinary: Denied genitourinary symptoms including symptomatic vaginal discharge, pelvic relaxation issues, and urinary complaints.  Musculoskeletal: Denied musculoskeletal symptoms, stiffness, swelling, muscle weakness and myalgia.  Dermatologic: Denied dermatology symptoms, rash and scar.  Neurologic: Denied neurology symptoms, dizziness, headache, neck pain and syncope.  Psychiatric: Denied psychiatric symptoms, anxiety and depression.  Endocrine: Denied endocrine symptoms including hot flashes and night sweats.   Meds:   Current Outpatient Medications on File Prior to Visit  Medication Sig Dispense Refill  . Prenatal Vit-Fe Fumarate-FA (MULTIVITAMIN-PRENATAL) 27-0.8 MG TABS tablet Take 1 tablet by mouth daily.      No current facility-administered medications on file prior to visit.     Objective:     Vitals:   02/20/18 0945  BP: 118/79  Pulse: 88               Abdomen: Soft.  Non-tender.  No masses.  No HSM.  Incision/s: Intact.  Healing well.  No erythema.  No drainage.      Assessment:    [id number] Patient Active Problem List   Diagnosis Date Noted  . Term pregnancy 02/10/2018  . History of cesarean delivery 10/27/2017  . 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 Post-operative state     Excellent recovery   Plan:            1.  Advised patient to remove  Steri-Strips.  Wound care discussed in detail. Orders No orders of the defined types were placed in this encounter.   No orders of the defined types were placed in this encounter.     F/U  Return in about 5 weeks (around  03/27/2018).  Elonda Huskyavid J. , M.D. 02/20/2018 10:30 AM

## 2018-03-07 ENCOUNTER — Encounter: Payer: Self-pay | Admitting: Obstetrics and Gynecology

## 2018-03-07 ENCOUNTER — Ambulatory Visit (INDEPENDENT_AMBULATORY_CARE_PROVIDER_SITE_OTHER): Payer: Medicaid Other | Admitting: Obstetrics and Gynecology

## 2018-03-07 VITALS — BP 107/72 | HR 70 | Ht 63.0 in | Wt 189.2 lb

## 2018-03-07 DIAGNOSIS — Z9889 Other specified postprocedural states: Secondary | ICD-10-CM

## 2018-03-07 NOTE — Progress Notes (Signed)
Patient comes in today to be released to go back to work. She is 4 week post op from C section. Denies any pain. She states that she is doing good.

## 2018-03-07 NOTE — Progress Notes (Signed)
HPI:      [name] is a 33 y.o. [id number] who LMP was No LMP recorded.  Subjective:   She presents today approximately 5 weeks postpartum and postoperative from cesarean delivery and tubal occlusion.  She is doing well.  She continues to breast-feed intermittently.  She is pumping part of the time.  She has no complaints she would like to return to work. Of significant note she states that she has had irregular cycles her entire life and does not know if she will cycle normally after breast-feeding.    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 She does not have any pertinent problems on file. She  has a past surgical history that includes colposcopy; Cesarean section; Wisdom tooth extraction; and Cesarean section with bilateral tubal ligation (Bilateral, 02/10/2018). Her family history includes Breast cancer in her maternal aunt; Diabetes in her maternal grandfather; Heart disease in her mother; Ovarian cancer in her maternal aunt. She  reports that she quit smoking about 9 months ago. Her smoking use included cigarettes. She has never used smokeless tobacco. She reports current alcohol use. She reports current drug use. Drug: Marijuana. She currently has no medications in their medication list. She has No Known Allergies.       Review of Systems:  Review of Systems  Constitutional: Denied constitutional symptoms, night sweats, recent illness, fatigue, fever, insomnia and weight loss.  Eyes: Denied eye symptoms, eye pain, photophobia, vision change and visual disturbance.  Ears/Nose/Throat/Neck: Denied ear, nose, throat or neck symptoms, hearing loss, nasal discharge, sinus  congestion and sore throat.  Cardiovascular: Denied cardiovascular symptoms, arrhythmia, chest pain/pressure, edema, exercise intolerance, orthopnea and palpitations.  Respiratory: Denied pulmonary symptoms, asthma, pleuritic pain, productive sputum, cough, dyspnea and wheezing.  Gastrointestinal: Denied, gastro-esophageal reflux, melena, nausea and vomiting.  Genitourinary: Denied genitourinary symptoms including symptomatic vaginal discharge, pelvic relaxation issues, and urinary complaints.  Musculoskeletal: Denied musculoskeletal symptoms, stiffness, swelling, muscle weakness and myalgia.  Dermatologic: Denied dermatology symptoms, rash and scar.  Neurologic: Denied neurology symptoms, dizziness, headache, neck pain and syncope.  Psychiatric: Denied psychiatric symptoms, anxiety and depression.  Endocrine: Denied endocrine symptoms including hot flashes and night sweats.   Meds:   No current outpatient medications on file prior to visit.   No current facility-administered medications on file prior to visit.     Objective:     Vitals:   03/07/18 0821  BP: 107/72  Pulse: 70               Abdomen: Soft.  Non-tender.  No masses.  No HSM.  Incision/s: Intact.  Healing well.  No erythema.  No drainage.      Assessment:    [id number] Patient Active Problem List   Diagnosis Date Noted  . Term pregnancy 02/10/2018  . History of cesarean delivery 10/27/2017  . 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 Post-operative state   2. Postpartum care and examination immediately after delivery     Patient with excellent recovery status post cesarean delivery and tubal ligation.   Plan:            1.  Patient may resume normal activities-return to work  2.  Patient to discussed future menstrual cycles and the regularity.  Consider at least twice a year progesterone cycling if she is very irregular.  Consider PCO testing as well.  I discussed this with her in some detail. Orders No  orders of the defined types were placed in this encounter.   No orders of the defined types were placed in this encounter.     F/U  Return in about 4 months (around 07/06/2018) for Annual Physical.  Elonda Huskyavid J. Cayden Rautio, M.D. 03/07/2018 8:54 AM

## 2018-03-28 ENCOUNTER — Encounter: Payer: 59 | Admitting: Obstetrics and Gynecology

## 2018-07-06 ENCOUNTER — Encounter: Payer: 59 | Admitting: Obstetrics and Gynecology

## 2018-07-26 ENCOUNTER — Other Ambulatory Visit: Payer: Self-pay | Admitting: Surgical

## 2018-07-26 ENCOUNTER — Other Ambulatory Visit: Payer: Self-pay | Admitting: *Deleted

## 2018-07-26 DIAGNOSIS — R52 Pain, unspecified: Secondary | ICD-10-CM

## 2018-07-29 LAB — NOVEL CORONAVIRUS, NAA: SARS-CoV-2, NAA: DETECTED — AB

## 2018-07-31 ENCOUNTER — Telehealth: Payer: Self-pay | Admitting: Surgical

## 2018-07-31 NOTE — Telephone Encounter (Signed)
Notified patient of lab results and have moved her appointment out one month. I explained that she would need to keep a check on her symptoms and if they get worse to be seen in the ED.

## 2018-07-31 NOTE — Telephone Encounter (Signed)
LM for patient to return call to office for test results.

## 2018-07-31 NOTE — Telephone Encounter (Signed)
Tried to call patient for results. Sounded like someone picked up and hung up.

## 2018-08-10 ENCOUNTER — Encounter: Payer: 59 | Admitting: Obstetrics and Gynecology

## 2018-09-07 ENCOUNTER — Encounter: Payer: 59 | Admitting: Obstetrics and Gynecology

## 2018-10-05 ENCOUNTER — Encounter: Payer: Self-pay | Admitting: Obstetrics and Gynecology

## 2018-10-05 ENCOUNTER — Ambulatory Visit (INDEPENDENT_AMBULATORY_CARE_PROVIDER_SITE_OTHER): Payer: Medicaid Other | Admitting: Obstetrics and Gynecology

## 2018-10-05 ENCOUNTER — Other Ambulatory Visit: Payer: Self-pay

## 2018-10-05 VITALS — BP 98/66 | HR 71 | Ht 62.0 in | Wt 192.0 lb

## 2018-10-05 DIAGNOSIS — Z0000 Encounter for general adult medical examination without abnormal findings: Secondary | ICD-10-CM

## 2018-10-05 DIAGNOSIS — Z01419 Encounter for gynecological examination (general) (routine) without abnormal findings: Secondary | ICD-10-CM

## 2018-10-05 NOTE — Progress Notes (Signed)
HPI:      [name] is a 33 y.o. [id number] who LMP was Patient's last menstrual period was 09/22/2018.  Subjective:   She presents today for her annual examination.  She has no complaints.  She says her menses have now stabilized and she has a period every month. She is going back to college and hopes to finish her degree. 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 She does not have any pertinent problems on file. She  has a past surgical history that includes colposcopy; Cesarean section; Wisdom tooth extraction; and Cesarean section with bilateral tubal ligation (Bilateral, 02/10/2018). Her family history includes Breast cancer in her maternal aunt; Diabetes in her maternal grandfather; Heart disease in her mother; Ovarian cancer in her maternal aunt. She  reports that she quit smoking about 16 months ago. Her smoking use included cigarettes. She has never used smokeless tobacco. She reports current alcohol use. She reports current drug use. Drug: Marijuana. She currently has no medications in their medication list. She has No Known Allergies.       Review of Systems:  Review of Systems  Constitutional: Denied constitutional symptoms, night sweats, recent illness, fatigue, fever, insomnia and weight loss.  Eyes: Denied eye symptoms, eye pain, photophobia, vision change and visual disturbance.  Ears/Nose/Throat/Neck: Denied ear, nose, throat or neck symptoms, hearing loss, nasal discharge, sinus congestion and sore throat.  Cardiovascular: Denied cardiovascular symptoms, arrhythmia, chest pain/pressure, edema, exercise intolerance,  orthopnea and palpitations.  Respiratory: Denied pulmonary symptoms, asthma, pleuritic pain, productive sputum, cough, dyspnea and wheezing.  Gastrointestinal: Denied, gastro-esophageal reflux, melena, nausea and vomiting.  Genitourinary: Denied genitourinary symptoms including symptomatic vaginal discharge, pelvic relaxation issues, and urinary complaints.  Musculoskeletal: Denied musculoskeletal symptoms, stiffness, swelling, muscle weakness and myalgia.  Dermatologic: Denied dermatology symptoms, rash and scar.  Neurologic: Denied neurology symptoms, dizziness, headache, neck pain and syncope.  Psychiatric: Denied psychiatric symptoms, anxiety and depression.  Endocrine: Denied endocrine symptoms including hot flashes and night sweats.   Meds:   No current outpatient medications on file prior to visit.   No current facility-administered medications on file prior to visit.     Objective:     Vitals:   10/05/18 0856  BP: 98/66  Pulse: 71              Physical examination General NAD, Conversant  HEENT Atraumatic; Op clear with mmm.  Normo-cephalic. Pupils reactive. Anicteric sclerae  Thyroid/Neck Smooth without nodularity or enlargement. Normal ROM.  Neck Supple.  Skin No rashes, lesions or ulceration. Normal palpated skin turgor. No nodularity.  Breasts: No masses or discharge.  Symmetric.  No axillary adenopathy.  Lungs: Clear to auscultation.No rales or wheezes. Normal Respiratory effort, no retractions.  Heart: NSR.  No murmurs or rubs appreciated. No periferal edema  Abdomen: Soft.  Non-tender.  No masses.  No HSM. No hernia  Extremities: Moves all appropriately.  Normal ROM for age. No lymphadenopathy.  Neuro: Oriented to PPT.  Normal mood. Normal affect.     Pelvic:   Vulva: Normal appearance.  No lesions.  Vagina: No lesions or abnormalities noted.  Support:  Normal pelvic support.  Urethra No masses tenderness or scarring.  Meatus Normal size without lesions or  prolapse.  Cervix: Normal appearance.  No lesions.  Anus: Normal exam.  No lesions.  Perineum: Normal exam.  No lesions.        Bimanual   Uterus: Normal size.  Non-tender.  Mobile.  AV.  Adnexae: No masses.  Non-tender to palpation.  Cul-de-sac: Negative for abnormality.      Assessment:    [id number] Patient Active Problem List   Diagnosis Date Noted  . Term pregnancy 02/10/2018  . History of cesarean delivery 10/27/2017  . 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 Well woman exam with routine gynecological exam        Plan:            1.  Basic Screening Recommendations The basic screening recommendations for asymptomatic women were discussed with the patient during her visit.  The age-appropriate recommendations were discussed with her and the rational for the tests reviewed.  When I am informed by the patient that another primary care physician has previously obtained the age-appropriate tests and they are up-to-date, only outstanding tests are ordered and referrals given as necessary.  Abnormal results of tests will be discussed with her when all of her results are completed. Patient to inform us if her menses become irregular again. Orders No orders of the defined types were placed in this encounter.   No orders of the defined types were placed in this encounter.       F/U  No follow-ups on file.  Finis Bud, M.D. 10/05/2018 9:34 AM

## 2018-10-05 NOTE — Progress Notes (Signed)
Patient comes in today for her yearly physical. She has no concerns today. She is not due for anything.

## 2019-09-12 ENCOUNTER — Encounter: Payer: Medicaid Other | Admitting: Obstetrics and Gynecology

## 2019-12-10 NOTE — Progress Notes (Deleted)
Pt present for annual exam.  

## 2019-12-18 ENCOUNTER — Encounter: Payer: Medicaid Other | Admitting: Obstetrics and Gynecology

## 2019-12-18 DIAGNOSIS — Z01419 Encounter for gynecological examination (general) (routine) without abnormal findings: Secondary | ICD-10-CM

## 2019-12-20 ENCOUNTER — Telehealth: Payer: Self-pay

## 2019-12-20 ENCOUNTER — Encounter: Payer: Self-pay | Admitting: Obstetrics and Gynecology

## 2019-12-20 NOTE — Telephone Encounter (Signed)
Called patient because she missed her last scheduled appt with Dr. Valentino Saxon. Called to see if she would like to reschedule her appointment. LVM for her to call the office. Also sending a MyChart message to patient.

## 2022-01-29 ENCOUNTER — Ambulatory Visit
Admission: EM | Admit: 2022-01-29 | Discharge: 2022-01-29 | Disposition: A | Discharge status: Home / Self Care | Payer: Medicaid Other | Attending: Physician Assistant | Admitting: Physician Assistant

## 2022-01-29 DIAGNOSIS — K047 Periapical abscess without sinus: Secondary | ICD-10-CM | POA: Diagnosis not present

## 2022-01-29 MED ORDER — CEFTRIAXONE SODIUM 1 G IJ SOLR
1.0000 g | Freq: Once | INTRAMUSCULAR | Status: AC
  Administered 2022-01-29: 1 g via INTRAMUSCULAR

## 2022-01-29 MED ORDER — TRAMADOL HCL 50 MG PO TABS: 50.0000 mg | ORAL_TABLET | Freq: Four times a day (QID) | ORAL | 0 refills | Status: AC | PRN

## 2022-01-29 MED ORDER — AMOXICILLIN-POT CLAVULANATE 875-125 MG PO TABS: 1.0000 | ORAL_TABLET | Freq: Two times a day (BID) | ORAL | 0 refills | Status: AC

## 2022-01-29 NOTE — ED Provider Notes (Signed)
[facility]    CSN: [id number] Arrival date & time: 01/29/22  1635      History   Chief Complaint Chief Complaint  Patient presents with   Dental Pain    HPI Leslie Walters is a 36 y.o. female.   Patient here today for evaluation of dental abscess.  She reports that she has had pain to her right upper back molars for the last 4 to 5 days but yesterday developed an actual area of drainage to the area.  She has not had any fever that she is aware of.  She does report taking Tylenol and ibuprofen with minimal relief.  She has associated facial swelling to the right side.  She does have appointment with dentist next week.  The history is provided by the patient.  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    Past Surgical History:  Procedure Laterality Date   CESAREAN SECTION     CESAREAN SECTION WITH BILATERAL TUBAL LIGATION Bilateral 02/10/2018   Procedure: REPEAT CESAREAN SECTION WITH BILATERAL TUBAL LIGATION;  Surgeon: Linzie Collin, MD;  Location: [facility] ORS;  Service: Obstetrics;  Laterality: Bilateral;   colposcopy     WISDOM TOOTH EXTRACTION      OB History     Gravida  4   Para  2   Term  2   Preterm      AB  2   Living  2      SAB      IAB  0   Ectopic      Multiple  0   Live Births  2            Home Medications    Prior to Admission medications   Medication Sig Start Date End Date Taking? Authorizing Provider  amoxicillin-clavulanate (AUGMENTIN) 875-125 MG tablet Take 1 tablet by mouth  every 12 (twelve) hours. 01/29/22  Yes Tomi Bamberger, PA-C  traMADol (ULTRAM) 50 MG tablet Take 1 tablet (50 mg total) by mouth every 6 (six) hours as needed. 01/29/22  Yes Tomi Bamberger, PA-C    Family History Family History  Problem Relation Age of Onset   Heart disease Mother    Ovarian cancer Maternal Aunt    Breast cancer Maternal Aunt    Diabetes Maternal Grandfather    Colon cancer Neg Hx     Social History Social History   Tobacco Use   Smoking status: Former    Types: Cigarettes    Quit date: 05/09/2017    Years since quitting: 4.7   Smokeless tobacco: Never  Vaping Use   Vaping Use: Never used  Substance Use Topics   Alcohol use: Yes    Comment: occas   Drug use: Yes    Types: Marijuana    Comment: 5 months ago     Allergies   Patient has no known allergies.   Review of Systems Review of Systems  Constitutional:  Negative for  chills and fever.  HENT:  Positive for dental problem.   Eyes:  Negative for discharge and redness.  Respiratory:  Negative for shortness of breath.   Gastrointestinal:  Negative for abdominal pain, nausea and vomiting.     Physical Exam Triage Vital Signs ED Triage Vitals  Enc Vitals Group     BP 01/29/22 1755 118/79     Pulse Rate 01/29/22 1755 77     Resp 01/29/22 1755 16     Temp 01/29/22 1755 97.8 F (36.6 C)     Temp Source 01/29/22 1755 Oral     SpO2 01/29/22 1755 97 %     Weight --      Height --      Head Circumference --      Peak Flow --      Pain Score 01/29/22 1802 10     Pain Loc --      Pain Edu? --      Excl. in GC? --    No data found.  Updated Vital Signs BP 118/79 (BP Location: Right Arm)   Pulse 77   Temp 97.8 F (36.6 C) (Oral)   Resp 16   SpO2 97%   Breastfeeding No      Physical Exam Vitals and nursing note reviewed.  Constitutional:      General: She is not in acute distress.    Appearance: Normal appearance. She is not ill-appearing.  HENT:     Head: Normocephalic and  atraumatic.     Mouth/Throat:     Comments: Mild swelling appreciated to right lateral maxillary area, significant gingival swelling to right upper molars with purulent drainage noted Eyes:     Conjunctiva/sclera: Conjunctivae normal.  Cardiovascular:     Rate and Rhythm: Normal rate.  Pulmonary:     Effort: Pulmonary effort is normal.  Neurological:     Mental Status: She is alert.  Psychiatric:        Mood and Affect: Mood normal.        Behavior: Behavior normal.        Thought Content: Thought content normal.      UC Treatments / Results  Labs (all labs ordered are listed, but only abnormal results are displayed) Labs Reviewed - No data to display  EKG   Radiology No results found.  Procedures Procedures (including critical care time)  Medications Ordered in UC Medications  cefTRIAXone (ROCEPHIN) injection 1 g (1 g Intramuscular Given 01/29/22 1905)    Initial Impression / Assessment and Plan / UC Course  I have reviewed the triage vital signs and the nursing notes.  Pertinent labs & imaging results that were available during my care of the patient were reviewed by me and considered in my medical decision making (see chart for details).    Rocephin administered in office given significant swelling/ concern for infection.  Will also prescribe Augmentin to begin taking orally.  Tramadol prescribed for significant pain.  Recommended further evaluation in the emergency room if no gradual improvement over the next several hours or sooner with any worsening.  Patient to follow-up with dentist as scheduled.  Final Clinical Impressions(s) / UC Diagnoses   Final diagnoses:  Dental abscess   Discharge Instructions   None    ED Prescriptions     Medication Sig Dispense Auth. Provider   amoxicillin-clavulanate (AUGMENTIN) 875-125 MG tablet Take 1 tablet by mouth every 12 (twelve) hours. 14 tablet Erma Pinto F, PA-C   traMADol Janean Sark) 50  MG tablet Take 1 tablet  (50 mg total) by mouth every 6 (six) hours as needed. 15 tablet Tomi Bamberger, PA-C      I have reviewed the PDMP during this encounter.   Tomi Bamberger, PA-C 01/29/22 1921

## 2022-01-29 NOTE — ED Triage Notes (Signed)
Pt c/o abscess to upper right oral/teeth region onset ~ 4-5 days ago causing severe pain since it has gotten much worse starting yesterday states trying OTC motrin and tylenol together without relief in addition to OTC "numbing medicine."

## 2022-05-13 NOTE — Progress Notes (Signed)
I,[name],acting as a scribe for Gwyneth Sprout, FNP.,have documented all relevant documentation on the behalf of Gwyneth Sprout, FNP,as directed by  Gwyneth Sprout, FNP while in the presence of Gwyneth Sprout, FNP.  New patient visit   Patient: Leslie Walters   DOB: 11-Apr-1985   37 y.o. Female  MRN: [id number] Visit Date: 05/17/2022  Today's healthcare provider: Gwyneth Sprout, FNP   No chief complaint on file.  Subjective    Leslie Walters is a 37 y.o. female who presents today as a new patient to establish care.      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)    Seasonal allergies    Past Surgical History:  Procedure Laterality Date   CESAREAN SECTION     CESAREAN SECTION WITH BILATERAL TUBAL LIGATION Bilateral 02/10/2018   Procedure: REPEAT CESAREAN SECTION WITH BILATERAL TUBAL LIGATION;  Surgeon: Harlin Heys, MD;  Location: [facility] OR[facility];  Service: Obstetrics;  Laterality: Bilateral;   colposcopy     WISDOM TOOTH EXTRACTION     Family Status  Relation Name Status   Mother  (Not Specified)   Mat Aunt  (Not Specified)   MGF  (Not Specified)   Neg Hx  (Not Specified)   Family History  Problem Relation Age of Onset   Heart disease Mother    Ovarian cancer Maternal Aunt    Breast cancer Maternal Aunt    Diabetes Maternal Grandfather    Colon cancer Neg Hx    Social History   Socioeconomic History   Marital status: Significant Other    Spouse name: Not on file   Number of children: Not on file   Years of education: Not on file   Highest education level: Not on file  Occupational History   Not on file  Tobacco Use   Smoking status: Former    Types: Cigarettes    Quit date: 05/09/2017    Years since quitting: 5.0   Smokeless tobacco: Never   Vaping Use   Vaping Use: Never used  Substance and Sexual Activity   Alcohol use: Yes    Comment: occas   Drug use: Yes    Types: Marijuana    Comment: 5 months ago   Sexual activity: Yes    Birth control/protection: None  Other Topics Concern   Not on file  Social History Narrative   Not on file   Social Determinants of Health   Financial Resource Strain: Low Risk  (02/10/2018)   Overall Financial Resource Strain (CARDIA)    Difficulty of Paying Living Expenses: Not hard at all  Food Insecurity: No Food Insecurity (02/10/2018)   Hunger Vital Sign    Worried About Running Out of Food in the Last Year: Never true    [place] in the Last Year: Never true  Transportation Needs: No Transportation Needs (02/10/2018)   PRAPARE - Hydrologist (Medical): No    Lack of Transportation (Non-Medical): No  Physical Activity: Not on file  Stress: Not on file  Social Connections: Not on file   Outpatient Medications Prior to Visit  Medication Sig   amoxicillin-clavulanate (AUGMENTIN) 875-125 MG tablet Take 1  tablet by mouth every 12 (twelve) hours.   traMADol (ULTRAM) 50 MG tablet Take 1 tablet (50 mg total) by mouth every 6 (six) hours as needed.   No facility-administered medications prior to visit.   No Known Allergies  Immunization History  Administered Date(s) Administered   Influenza,inj,Quad PF,6+ Mos 10/27/2017   Tdap 11/24/2017    Health Maintenance  Topic Date Due   COVID-19 Vaccine (1) Never done   Hepatitis C Screening  Never done   PAP SMEAR-Modifier  08/01/2020   INFLUENZA VACCINE  09/22/2021   DTaP/Tdap/Td (2 - Td or Tdap) 11/25/2027   HIV Screening  Completed   HPV VACCINES  Aged Out    Patient Care Team: Patient, No Pcp Per as PCP - General (General Practice)  Review of Systems  Gastrointestinal:  Positive for abdominal pain.       Objective    There were no vitals taken for this visit.   Physical  Exam  Patient did not show for appt; no PE was completed  Depression Screen     No data to display         No results found for any visits on 05/17/22.  Assessment & Plan      Patient did not show for appt; no POC was discussed  No follow-ups on file.    Vonna Kotyk, FNP, have reviewed all documentation for this visit. The documentation on 05/17/22 for the exam, diagnosis, procedures, and orders are all accurate and complete.  Gwyneth Sprout, [place] [phone] (phone) [phone] (fax)  [place]

## 2022-05-17 ENCOUNTER — Ambulatory Visit (INDEPENDENT_AMBULATORY_CARE_PROVIDER_SITE_OTHER): Payer: BC Managed Care – PPO | Admitting: Family Medicine

## 2022-05-17 DIAGNOSIS — Z91199 Patient's noncompliance with other medical treatment and regimen due to unspecified reason: Secondary | ICD-10-CM | POA: Insufficient documentation

## 2023-06-20 ENCOUNTER — Ambulatory Visit (INDEPENDENT_AMBULATORY_CARE_PROVIDER_SITE_OTHER)

## 2023-06-20 ENCOUNTER — Ambulatory Visit
Admission: EM | Admit: 2023-06-20 | Discharge: 2023-06-20 | Disposition: A | Discharge status: Home / Self Care | Attending: Family Medicine | Admitting: Family Medicine

## 2023-06-20 DIAGNOSIS — M79645 Pain in left finger(s): Secondary | ICD-10-CM | POA: Diagnosis not present

## 2023-06-20 DIAGNOSIS — S60032A Contusion of left middle finger without damage to nail, initial encounter: Secondary | ICD-10-CM | POA: Diagnosis not present

## 2023-06-20 NOTE — Discharge Instructions (Signed)
 Continue the finger splint as needed.  He may elevate and ice as needed.  Continue over-the-counter Tylenol  or ibuprofen  as needed for pain.  You may follow-up with your PCP if symptoms do not improve.  Please go to the ER if you develop any worsening symptoms.  Hope you feel better soon!

## 2023-06-20 NOTE — ED Triage Notes (Signed)
 Patient presents to UC for left middle finger injury. States she was moving dough trays and finger got caught between two trays. States stabbing shooting pain yesterday, limited ROM, numbness/tingling, pain worse with extension. Treating pain with ibuprofen , applied finger splint. States she wants to make sure no fracture.

## 2023-06-20 NOTE — ED Provider Notes (Signed)
 Arlander Bellman    CSN: [id number] Arrival date & time: 06/20/23  1439      History   Chief Complaint Chief Complaint  Patient presents with   Finger Injury    HPI Leslie Walters is a 38 y.o. female for finger injury.  Patient reports 3 days ago while at work she got her left third distal finger caught between 2 trays of dough.  Reports immediate pain and and swelling.  Yesterday while at work she noticed some shooting pain down into her hand and wants to confirm she did not break the finger.  Denies any bruising or numbness or tingling.  No history of injuries or surgeries to this finger in the past.  She is not on blood thinning medications.  She has been using over-the-counter ibuprofen  and a finger splint for symptoms.  She does not want to follow a Worker's Comp. claim.  No other concerns at this time.  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    Past Surgical History:  Procedure Laterality Date   CESAREAN SECTION     CESAREAN SECTION WITH BILATERAL TUBAL LIGATION Bilateral 02/10/2018   Procedure: REPEAT CESAREAN SECTION WITH BILATERAL TUBAL LIGATION;  Surgeon: Zenobia Hila, MD;  Location: [facility] ORS;  Service: Obstetrics;  Laterality: Bilateral;   colposcopy     WISDOM TOOTH EXTRACTION      OB History     Gravida  4   Para  2   Term  2   Preterm      AB  2   Living  2      SAB      IAB  0   Ectopic      Multiple  0   Live Births  2            Home Medications    Prior to Admission medications    Medication Sig Start Date End Date Taking? Authorizing Provider  amoxicillin -clavulanate (AUGMENTIN ) 875-125 MG tablet Take 1 tablet by mouth every 12 (twelve) hours. 01/29/22   Vernestine Gondola, PA-C  traMADol  (ULTRAM ) 50 MG tablet Take 1 tablet (50 mg total) by mouth every 6 (six) hours as needed. 01/29/22   Vernestine Gondola, PA-C    Family History Family History  Problem Relation Age of Onset   Heart disease Mother    Ovarian cancer Maternal Aunt    Breast cancer Maternal Aunt    Diabetes Maternal Grandfather    Colon cancer Neg Hx     Social History Social History   Tobacco Use   Smoking status: Former    Current packs/day: 0.00    Types: Cigarettes    Quit date: 05/09/2017    Years since quitting: 6.1   Smokeless tobacco: Never  Vaping Use   Vaping status: Never Used  Substance Use Topics   Alcohol use: Yes    Comment: occas   Drug use: Yes    Types: Marijuana    Comment:  5 months ago     Allergies   Patient has no known allergies.   Review of Systems Review of Systems  Musculoskeletal:        Left 3rd finger pain/injury     Physical Exam Triage Vital Signs ED Triage Vitals  Encounter Vitals Group     BP 06/20/23 1522 112/70     Systolic BP Percentile --      Diastolic BP Percentile --      Pulse Rate 06/20/23 1522 85     Resp 06/20/23 1522 18     Temp 06/20/23 1522 97.8 F (36.6 C)     Temp src --      SpO2 06/20/23 1522 98 %     Weight --      Height --      Head Circumference --      Peak Flow --      Pain Score 06/20/23 1528 3     Pain Loc --      Pain Education --      Exclude from Growth Chart --    No data found.  Updated Vital Signs BP 112/70   Pulse 85   Temp 97.8 F (36.6 C)   Resp 18   LMP 06/20/2023 (Approximate)   SpO2 98%   Visual Acuity Right Eye Distance:   Left Eye Distance:   Bilateral Distance:    Right Eye Near:   Left Eye Near:    Bilateral Near:     Physical Exam Vitals and nursing note reviewed.   Constitutional:      General: She is not in acute distress.    Appearance: Normal appearance. She is not ill-appearing.  HENT:     Head: Normocephalic and atraumatic.  Eyes:     Pupils: Pupils are equal, round, and reactive to light.  Cardiovascular:     Rate and Rhythm: Normal rate.  Pulmonary:     Effort: Pulmonary effort is normal.  Musculoskeletal:       Hands:     Comments: Mild swelling at the distal left third finger without ecchymosis or erythema.  Skin is intact.  Cap refill +2.  Tender to palpation from PIP to DIP joint.  No tenderness to MCP joint.  Reduced range of motion due to pain and swelling.  No nail damage.  Skin:    General: Skin is warm and dry.  Neurological:     General: No focal deficit present.     Mental Status: She is alert and oriented to person, place, and time.  Psychiatric:        Mood and Affect: Mood normal.        Behavior: Behavior normal.      UC Treatments / Results  Labs (all labs ordered are listed, but only abnormal results are displayed) Labs Reviewed - No data to display  EKG   Radiology No results found.  Procedures Procedures (including critical care time)  Medications Ordered in UC Medications - No data to display  Initial Impression / Assessment and Plan / UC Course  I have reviewed the triage vital signs and the nursing notes.  Pertinent labs & imaging results that were available during my care of the patient were reviewed by me and considered in my medical decision making (see chart for details).     Reviewed exam and symptoms with patient.  No red flags.  Wet read of x-ray without obvious abnormality, will contact for any positive results based on  radiology overread.  Discussed likely contusion/soft tissue injury and continued RICE therapy.  She will continue the finger splint that she already has.  OTC analgesics as needed.  PCP follow-up if symptoms do not improve.  ER precautions reviewed and patient verbalized  understanding. Final Clinical Impressions(s) / UC Diagnoses   Final diagnoses:  Pain of finger of left hand  Contusion of left middle finger without damage to nail, initial encounter     Discharge Instructions      Continue the finger splint as needed.  He may elevate and ice as needed.  Continue over-the-counter Tylenol  or ibuprofen  as needed for pain.  You may follow-up with your PCP if symptoms do not improve.  Please go to the ER if you develop any worsening symptoms.  Hope you feel better soon!     ED Prescriptions   None    PDMP not reviewed this encounter.   Alleen Arbour, NP 06/20/23 1606
